# Patient Record
Sex: Male | Born: 1965 | Race: White | Hispanic: No | Marital: Married | Smoking: Current every day smoker
Health system: Southern US, Community
[De-identification: ages and names within clinical notes are randomized; demographics above are authoritative.]

## PROBLEM LIST (undated history)

## (undated) DIAGNOSIS — N200 Calculus of kidney: Secondary | ICD-10-CM

## (undated) DIAGNOSIS — M545 Low back pain, unspecified: Secondary | ICD-10-CM

## (undated) HISTORY — DX: Low back pain, unspecified: M54.50

## (undated) HISTORY — PX: LITHOTRIPSY: SUR834

## (undated) HISTORY — DX: Low back pain: M54.5

---

## 2011-10-15 ENCOUNTER — Ambulatory Visit: Payer: Self-pay

## 2014-12-01 ENCOUNTER — Encounter: Payer: Self-pay | Admitting: *Deleted

## 2014-12-01 ENCOUNTER — Emergency Department
Admission: EM | Admit: 2014-12-01 | Discharge: 2014-12-01 | Disposition: A | Discharge status: Home / Self Care | Payer: BLUE CROSS/BLUE SHIELD | Attending: Emergency Medicine | Admitting: Emergency Medicine

## 2014-12-01 ENCOUNTER — Emergency Department: Payer: BLUE CROSS/BLUE SHIELD

## 2014-12-01 DIAGNOSIS — N23 Unspecified renal colic: Secondary | ICD-10-CM | POA: Diagnosis not present

## 2014-12-01 DIAGNOSIS — Z87442 Personal history of urinary calculi: Secondary | ICD-10-CM | POA: Insufficient documentation

## 2014-12-01 DIAGNOSIS — Z720 Tobacco use: Secondary | ICD-10-CM | POA: Diagnosis not present

## 2014-12-01 DIAGNOSIS — R109 Unspecified abdominal pain: Secondary | ICD-10-CM | POA: Diagnosis present

## 2014-12-01 HISTORY — DX: Calculus of kidney: N20.0

## 2014-12-01 LAB — BASIC METABOLIC PANEL
Anion gap: 6 (ref 5–15)
BUN: 18 mg/dL (ref 6–20)
CO2: 27 mmol/L (ref 22–32)
CREATININE: 1.25 mg/dL — AB (ref 0.61–1.24)
Calcium: 9.8 mg/dL (ref 8.9–10.3)
Chloride: 103 mmol/L (ref 101–111)
GFR calc Af Amer: >60 mL/min (ref >60)
GLUCOSE: 106 mg/dL — AB (ref 65–99)
POTASSIUM: 4.1 mmol/L (ref 3.5–5.1)
Sodium: 136 mmol/L (ref 135–145)

## 2014-12-01 LAB — URINALYSIS COMPLETE WITH MICROSCOPIC (ARMC ONLY)
BACTERIA UA: NONE SEEN
Bilirubin Urine: NEGATIVE
Glucose, UA: NEGATIVE mg/dL
Ketones, ur: NEGATIVE mg/dL
LEUKOCYTES UA: NEGATIVE
NITRITE: NEGATIVE
PH: 5 (ref 5.0–8.0)
PROTEIN: NEGATIVE mg/dL
SPECIFIC GRAVITY, URINE: 1.016 (ref 1.005–1.030)
Squamous Epithelial / LPF: NONE SEEN

## 2014-12-01 LAB — CBC
HEMATOCRIT: 51.7 % (ref 40.0–52.0)
Hemoglobin: 18 g/dL (ref 13.0–18.0)
MCH: 32.3 pg (ref 26.0–34.0)
MCHC: 34.8 g/dL (ref 32.0–36.0)
MCV: 92.9 fL (ref 80.0–100.0)
Platelets: 158 10*3/uL (ref 150–440)
RBC: 5.57 MIL/uL (ref 4.40–5.90)
RDW: 13.4 % (ref 11.5–14.5)
WBC: 15.1 10*3/uL — ABNORMAL HIGH (ref 3.8–10.6)

## 2014-12-01 MED ORDER — ONDANSETRON HCL 4 MG/2ML IJ SOLN
4.0000 mg | Freq: Once | INTRAMUSCULAR | Status: AC
  Administered 2014-12-01: 4 mg via INTRAVENOUS
  Filled 2014-12-01: qty 2

## 2014-12-01 MED ORDER — HYDROMORPHONE HCL 1 MG/ML IJ SOLN
1.0000 mg | Freq: Once | INTRAMUSCULAR | Status: AC
  Administered 2014-12-01: 1 mg via INTRAVENOUS

## 2014-12-01 MED ORDER — HYDROMORPHONE HCL 1 MG/ML IJ SOLN
0.5000 mg | Freq: Once | INTRAMUSCULAR | Status: AC
  Administered 2014-12-01: 0.5 mg via INTRAVENOUS
  Filled 2014-12-01: qty 1

## 2014-12-01 MED ORDER — HYDROMORPHONE HCL 1 MG/ML IJ SOLN
INTRAMUSCULAR | Status: AC
  Administered 2014-12-01: 1 mg via INTRAVENOUS
  Filled 2014-12-01: qty 1

## 2014-12-01 NOTE — ED Provider Notes (Signed)
CSN: [id number]     Arrival date & time 12/01/14  0308 History   First MD Initiated Contact with Patient 12/01/14 [phone]     Chief Complaint  Patient presents with  . Flank Pain     (Consider location/radiation/quality/duration/timing/severity/associated sxs/prior Treatment) HPI   Patient reports right-sided flank pain starting approximately 5 hours prior to arrival. It feels just like his previous kidney stone. Most of the pain is in the right CVA area with some does radiate to radiate around 20 groin. Patient has some dysuria and urgency. He did not see any blood in the time. Patient given some pain medicine and this improved the pain to where he could stand it. Pain began to come back a second dose was given which made the pain almost resolved slightly and he could tolerate things very well.  Past Medical History  Diagnosis Date  . Kidney stone    Past Surgical History  Procedure Laterality Date  . Lithotripsy Right    History reviewed. No pertinent family history. Social History  Substance Use Topics  . Smoking status: Current Every Day Smoker    Types: Cigarettes  . Smokeless tobacco: Current User    Types: Snuff  . Alcohol Use: 1.2 oz/week    2 Cans of beer per week     Comment: daily    Review of Systems  Review of 10 systems essentially negative except for the right CVA and abdominal pain and some nausea.  Allergies  Review of patient's allergies indicates no known allergies.  Home Medications   Prior to Admission medications   Not on File   BP 138/94 mmHg  Pulse 72  Temp(Src) 97.9 F (36.6 C) (Oral)  Resp 20  Ht 6' (1.829 m)  Wt 190 lb (86.183 kg)  BMI 25.76 kg/m2  SpO2 98% Physical Exam Constitutional well-developed well-nourished male in some pain. Head normocephalic/atraumatic. Eyes pupils equal round reactive to light night extraocular movements intact. Mouth normal no erythema or exudate. Heart regular rate and rhythm no murmurs. Lungs are clear  abdomen soft bowel sounds are positive there is no tenderness to palpation percussion. There is some tenderness in the right CVA area to percussion. Extremities are supple with no tenderness and no edema.  ED Course  Procedures (including critical care time) Labs Review Labs Reviewed  BASIC METABOLIC PANEL - Abnormal; Notable for the following:    Glucose, Bld 106 (*)    Creatinine, Ser 1.25 (*)    All other components within normal limits  CBC - Abnormal; Notable for the following:    WBC 15.1 (*)    All other components within normal limits  URINALYSIS COMPLETEWITH MICROSCOPIC ([facility] ONLY) - Abnormal; Notable for the following:    Color, Urine YELLOW (*)    APPearance CLEAR (*)    Hgb urine dipstick 2+ (*)    All other components within normal limits    Imaging Review Dg Abd 1 View  12/01/2014  CLINICAL DATA:  Right flank pain starting 5 hours ago. Vomiting. History of kidney stones. EXAM: ABDOMEN - 1 VIEW COMPARISON:  None. FINDINGS: 10.7 mm calcification in the right upper quadrant likely representing a stone in the right renal pelvis. No other renal stones a demonstrated. Calcified phleboliths in the pelvis. Diffusely stool-filled colon. No small or large bowel distention. Mild degenerative changes in the spine and hips. IMPRESSION: 10.7 mm calcification projected over the right renal pelvis likely representing a renal stone. Electronically Signed   By: Burman Nieves  M.D.   On: 12/01/2014 04:58   I have personally reviewed and evaluated these images and lab results as part of my medical decision-making.   EKG Interpretation None      MDM   Final diagnoses:  Renal colic        Arnaldo NatalPaul F Gethsemane Fischler, MD 12/01/14 [id number]

## 2014-12-01 NOTE — Discharge Instructions (Signed)
Kidney Stones °Kidney stones (urolithiasis) are deposits that form inside your kidneys. The intense pain is caused by the stone moving through the urinary tract. When the stone moves, the ureter goes into spasm around the stone. The stone is usually passed in the urine.  °CAUSES  °· A disorder that makes certain neck glands produce too much parathyroid hormone (primary hyperparathyroidism). °· A buildup of uric acid crystals, similar to gout in your joints. °· Narrowing (stricture) of the ureter. °· A kidney obstruction present at birth (congenital obstruction). °· Previous surgery on the kidney or ureters. °· Numerous kidney infections. °SYMPTOMS  °· Feeling sick to your stomach (nauseous). °· Throwing up (vomiting). °· Blood in the urine (hematuria). °· Pain that usually spreads (radiates) to the groin. °· Frequency or urgency of urination. °DIAGNOSIS  °· Taking a history and physical exam. °· Blood or urine tests. °· CT scan. °· Occasionally, an examination of the inside of the urinary bladder (cystoscopy) is performed. °TREATMENT  °· Observation. °· Increasing your fluid intake. °· Extracorporeal shock wave lithotripsy--This is a noninvasive procedure that uses shock waves to break up kidney stones. °· Surgery may be needed if you have severe pain or persistent obstruction. There are various surgical procedures. Most of the procedures are performed with the use of small instruments. Only small incisions are needed to accommodate these instruments, so recovery time is minimized. °The size, location, and chemical composition are all important variables that will determine the proper choice of action for you. Talk to your health care provider to better understand your situation so that you will minimize the risk of injury to yourself and your kidney.  °HOME CARE INSTRUCTIONS  °· Drink enough water and fluids to keep your urine clear or pale yellow. This will help you to pass the stone or stone fragments. °· Strain  all urine through the provided strainer. Keep all particulate matter and stones for your health care provider to see. The stone causing the pain may be as small as a grain of salt. It is very important to use the strainer each and every time you pass your urine. The collection of your stone will allow your health care provider to analyze it and verify that a stone has actually passed. The stone analysis will often identify what you can do to reduce the incidence of recurrences. °· Only take over-the-counter or prescription medicines for pain, discomfort, or fever as directed by your health care provider. °· Keep all follow-up visits as told by your health care provider. This is important. °· Get follow-up X-rays if required. The absence of pain does not always mean that the stone has passed. It may have only stopped moving. If the urine remains completely obstructed, it can cause loss of kidney function or even complete destruction of the kidney. It is your responsibility to make sure X-rays and follow-ups are completed. Ultrasounds of the kidney can show blockages and the status of the kidney. Ultrasounds are not associated with any radiation and can be performed easily in a matter of minutes. °· Make changes to your daily diet as told by your health care provider. You may be told to: °¨ Limit the amount of salt that you eat. °¨ Eat 5 or more servings of fruits and vegetables each day. °¨ Limit the amount of meat, poultry, fish, and eggs that you eat. °· Collect a 24-hour urine sample as told by your health care provider. You may need to collect another urine sample every 6-12   months. SEEK MEDICAL CARE IF:  You experience pain that is progressive and unresponsive to any pain medicine you have been prescribed. SEEK IMMEDIATE MEDICAL CARE IF:   Pain cannot be controlled with the prescribed medicine.  You have a fever or shaking chills.  The severity or intensity of pain increases over 18 hours and is not  relieved by pain medicine.  You develop a new onset of abdominal pain.  You feel faint or pass out.  You are unable to urinate.   This information is not intended to replace advice given to you by your health care provider. Make sure you discuss any questions you have with your health care provider.   Document Released: 02/02/2005 Document Revised: 10/24/2014 Document Reviewed: 07/06/2012 Elsevier Interactive Patient Education 2016 [employer]  You have a 10.7 mm stone on the right side. The stone appears to be in the kidney. Please tell the referral urologist I did not do a CT scan now. He probably will want to do one later. Most likely he will need lithotripsy. Use the Percocet 1 or 24 times a day as needed for the pain. Please return if the pain is not being controlled with pain medicine. Return also for fever vomiting or feeling sicker.

## 2014-12-01 NOTE — ED Notes (Signed)
Pt c/o R flank pain starting 5 hrs ago. Pt c/o vomiting x 1. Pt has hx of kidney stones. Pt pale and restless in triage.

## 2014-12-04 ENCOUNTER — Ambulatory Visit (INDEPENDENT_AMBULATORY_CARE_PROVIDER_SITE_OTHER): Payer: BLUE CROSS/BLUE SHIELD | Admitting: Urology

## 2014-12-04 ENCOUNTER — Encounter: Payer: Self-pay | Admitting: Urology

## 2014-12-04 VITALS — BP 128/79 | HR 81 | Ht 72.0 in | Wt 189.4 lb

## 2014-12-04 DIAGNOSIS — N200 Calculus of kidney: Secondary | ICD-10-CM | POA: Diagnosis not present

## 2014-12-04 MED ORDER — OXYCODONE-ACETAMINOPHEN 5-325 MG PO TABS: 1.0000 | ORAL_TABLET | Freq: Four times a day (QID) | ORAL | Status: DC | PRN

## 2014-12-04 MED ORDER — CEFUROXIME AXETIL 250 MG PO TABS: 250.0000 mg | ORAL_TABLET | Freq: Two times a day (BID) | ORAL | Status: DC

## 2014-12-04 NOTE — Progress Notes (Signed)
12/04/2014 3:07 PM   Nathaniel Howard 09/23/1965 [id number]  Referring provider: Duanne Limerick, MD [address] [place], [place] [address]  Chief Complaint  Patient presents with  . Nephrolithiasis    f/u hospital, history kidney stone.Pt been trested by urologist in the past Dr. Lonna Cobb    HPI: History of nephrolithiasis. As well for years ago otherwise basically has had no other genitourinary problems has no urgency frequency dysuria gross hematuria. It pain for this right sided upper ureteral calculus 10.1 mm in side has not had any ibuprofen over 48 hours.     PMH: Past Medical History  Diagnosis Date  . Kidney stone     Surgical History: Past Surgical History  Procedure Laterality Date  . Lithotripsy Right     Home Medications:    Medication List       This list is accurate as of: 12/04/14  3:07 PM.  Always use your most recent med list.               acetaminophen 325 MG tablet  Commonly known as:  TYLENOL  Take 325 mg by mouth every 6 (six) hours as needed.     ibuprofen 200 MG tablet  Commonly known as:  ADVIL,MOTRIN  Take 200 mg by mouth every 6 (six) hours as needed.     oxyCODONE-acetaminophen 10-325 MG tablet  Commonly known as:  PERCOCET  Take 1 tablet by mouth every 4 (four) hours as needed for pain.        Allergies: No Known Allergies  Family History: Family History  Problem Relation Age of Onset  . Nephrolithiasis Father   . Prostate cancer Neg Hx   . Sickle cell trait Neg Hx   . Kidney cancer Neg Hx     Social History:  reports that he has been smoking Cigarettes.  He has been smoking about 0.50 packs per day. His smokeless tobacco use includes Snuff. He reports that he drinks about 1.2 oz of alcohol per week. He reports that he does not use illicit drugs.  ROS: UROLOGY Frequent Urination?: Yes Hard to postpone urination?: No Burning/pain with urination?: No Get up at night to urinate?: Yes Leakage of urine?:  No Urine stream starts and stops?: Yes Trouble starting stream?: No Do you have to strain to urinate?: No Blood in urine?: No Urinary tract infection?: No Sexually transmitted disease?: No Injury to kidneys or bladder?: No Painful intercourse?: No Weak stream?: No Erection problems?: Yes Penile pain?: No  Gastrointestinal Nausea?: No Vomiting?: No Indigestion/heartburn?: No Diarrhea?: No Constipation?: No  Constitutional Fever: No Weight loss?: No Fatigue?: No  Skin Skin rash/lesions?: No Itching?: No  Eyes Blurred vision?: No Double vision?: No  Ears/Nose/Throat Sore throat?: No Sinus problems?: No  Hematologic/Lymphatic Swollen glands?: No Easy bruising?: No  Cardiovascular Leg swelling?: No Chest pain?: No  Respiratory Cough?: No Shortness of breath?: No  Endocrine Excessive thirst?: No  Musculoskeletal Back pain?: Yes Joint pain?: No  Neurological Headaches?: No Dizziness?: No  Psychologic Depression?: No Anxiety?: No  Physical Exam: BP 128/79 mmHg  Pulse 81  Ht 6' (1.829 m)  Wt 189 lb 6.4 oz (85.911 kg)  BMI 25.68 kg/m2  Constitutional:  Alert and oriented, No acute distress. HEENT: [place] AT, moist mucus membranes.  Trachea midline, no masses. Cardiovascular: No clubbing, cyanosis, or edema. Respiratory: Normal respiratory effort, no increased work of breathing. GI: Abdomen is soft, nontender, nondistended, no abdominal masses GU: No CVA tenderness.  Skin: No rashes, bruises or suspicious lesions. Lymph: No cervical or inguinal adenopathy. Neurologic: Grossly intact, no focal deficits, moving all 4 extremities. Psychiatric: Normal mood and affect.  Laboratory Data: Lab Results  Component Value Date   WBC 15.1* 12/01/2014   HGB 18.0 12/01/2014   HCT 51.7 12/01/2014   MCV 92.9 12/01/2014   PLT 158 12/01/2014    Lab Results  Component Value Date   CREATININE 1.25* 12/01/2014    No results found for: PSA  No results found  for: TESTOSTERONE  No results found for: HGBA1C  Urinalysis    Component Value Date/Time   COLORURINE YELLOW* 12/01/2014 0337   APPEARANCEUR CLEAR* 12/01/2014 0337   LABSPEC 1.016 12/01/2014 0337   PHURINE 5.0 12/01/2014 0337   GLUCOSEU NEGATIVE 12/01/2014 0337   HGBUR 2+* 12/01/2014 0337   BILIRUBINUR NEGATIVE 12/01/2014 0337   KETONESUR NEGATIVE 12/01/2014 0337   PROTEINUR NEGATIVE 12/01/2014 0337   NITRITE NEGATIVE 12/01/2014 0337   LEUKOCYTESUR NEGATIVE 12/01/2014 [id number]    Pertinent Imaging: KUB  Assessment & Plan:  Right upper ureteral calculus 10.1 mm plan right ESWL  1. Right nephrolithiasis second stone 4 years plan ESWL    No Follow-up on file.  Lorraine Laxichard D Teodoro Jeffreys, MD  [facility] [address], [address] [place], [place] [address] [phone]

## 2014-12-06 ENCOUNTER — Encounter
Admission: RE | Disposition: A | Discharge status: Home / Self Care | Payer: Self-pay | Source: Ambulatory Visit | Attending: Urology

## 2014-12-06 ENCOUNTER — Encounter: Payer: Self-pay | Admitting: *Deleted

## 2014-12-06 ENCOUNTER — Ambulatory Visit
Admission: RE | Admit: 2014-12-06 | Discharge: 2014-12-06 | Disposition: A | Discharge status: Home / Self Care | Payer: BLUE CROSS/BLUE SHIELD | Source: Ambulatory Visit | Attending: Urology | Admitting: Urology

## 2014-12-06 DIAGNOSIS — N201 Calculus of ureter: Secondary | ICD-10-CM | POA: Insufficient documentation

## 2014-12-06 HISTORY — PX: EXTRACORPOREAL SHOCK WAVE LITHOTRIPSY: SHX1557

## 2014-12-06 SURGERY — LITHOTRIPSY, ESWL
Anesthesia: Moderate Sedation | Laterality: Right

## 2014-12-06 MED ORDER — DIPHENHYDRAMINE HCL 25 MG PO CAPS
ORAL_CAPSULE | ORAL | Status: AC
  Filled 2014-12-06: qty 1

## 2014-12-06 MED ORDER — DIPHENHYDRAMINE HCL 25 MG PO CAPS
25.0000 mg | ORAL_CAPSULE | ORAL | Status: AC
Start: 2014-12-06 — End: 2014-12-06
  Administered 2014-12-06: 25 mg via ORAL

## 2014-12-06 MED ORDER — DIAZEPAM 5 MG PO TABS
10.0000 mg | ORAL_TABLET | ORAL | Status: AC
  Administered 2014-12-06: 10 mg via ORAL

## 2014-12-06 MED ORDER — DEXTROSE 5 % IV SOLN
1.0000 g | Freq: Once | INTRAVENOUS | Status: AC
  Administered 2014-12-06: 1 g via INTRAVENOUS
  Filled 2014-12-06: qty 10

## 2014-12-06 MED ORDER — SODIUM CHLORIDE 0.9 % IV SOLN
INTRAVENOUS | Status: DC
  Administered 2014-12-06: 18:00:00 via INTRAVENOUS

## 2014-12-06 MED ORDER — DIAZEPAM 5 MG PO TABS
ORAL_TABLET | ORAL | Status: AC
  Filled 2014-12-06: qty 2

## 2014-12-06 MED ORDER — DEXTROSE-NACL 5-0.45 % IV SOLN: INTRAVENOUS | Status: DC

## 2014-12-06 MED ORDER — ONDANSETRON HCL 4 MG/2ML IJ SOLN
4.0000 mg | Freq: Once | INTRAMUSCULAR | Status: AC
  Administered 2014-12-06: 4 mg via INTRAVENOUS

## 2014-12-06 MED ORDER — ONDANSETRON HCL 4 MG/2ML IJ SOLN
INTRAMUSCULAR | Status: AC
  Administered 2014-12-06: 4 mg via INTRAVENOUS
  Filled 2014-12-06: qty 2

## 2014-12-06 NOTE — Discharge Instructions (Signed)

## 2014-12-06 NOTE — OR Nursing (Signed)
Pt to PACU via bed awaiting procedure

## 2014-12-07 ENCOUNTER — Encounter: Payer: Self-pay | Admitting: Urology

## 2014-12-18 ENCOUNTER — Ambulatory Visit: Payer: BLUE CROSS/BLUE SHIELD | Admitting: Urology

## 2015-01-04 ENCOUNTER — Ambulatory Visit
Admission: RE | Admit: 2015-01-04 | Discharge: 2015-01-04 | Disposition: A | Discharge status: Home / Self Care | Payer: BLUE CROSS/BLUE SHIELD | Source: Ambulatory Visit | Attending: Urology | Admitting: Urology

## 2015-01-04 ENCOUNTER — Encounter: Payer: Self-pay | Admitting: Urology

## 2015-01-04 ENCOUNTER — Ambulatory Visit (INDEPENDENT_AMBULATORY_CARE_PROVIDER_SITE_OTHER): Payer: BLUE CROSS/BLUE SHIELD | Admitting: Urology

## 2015-01-04 VITALS — BP 120/84 | HR 86 | Ht 72.0 in | Wt 183.8 lb

## 2015-01-04 DIAGNOSIS — N200 Calculus of kidney: Secondary | ICD-10-CM | POA: Diagnosis present

## 2015-01-04 LAB — URINALYSIS, COMPLETE
BILIRUBIN UA: NEGATIVE
GLUCOSE, UA: NEGATIVE
Ketones, UA: NEGATIVE
Nitrite, UA: NEGATIVE
PH UA: 5.5 (ref 5.0–7.5)
PROTEIN UA: NEGATIVE
Specific Gravity, UA: 1.015 (ref 1.005–1.030)
Urobilinogen, Ur: 0.2 mg/dL (ref 0.2–1.0)

## 2015-01-04 LAB — MICROSCOPIC EXAMINATION: RBC, UA: >30 /hpf — ABNORMAL HIGH (ref 0–?)

## 2015-01-04 MED ORDER — TAMSULOSIN HCL 0.4 MG PO CAPS: 0.4000 mg | ORAL_CAPSULE | Freq: Every day | ORAL | Status: DC

## 2015-01-04 NOTE — Progress Notes (Signed)
01/04/2015 4:09 PM   Nathaniel Howard E Caster 08/04/1965 [id number]  Referring provider: Duanne Limerickeanna C Jones, MD [address] [place], [place] [address]  Chief Complaint  Patient presents with  . Follow-up    lithotripsy, KUB    HPI: The patient is a 49 year old male who is status post right ESWL for a right upper 1 cm ureteral calculus.  KUB today shows fragments in the right mid pole of the kidney as well as Steinstrasse in the right ureter   the patient is doing well since surgery. His pain is well-controlled. He is passing fragments of stone without difficulty.  PMH: Past Medical History  Diagnosis Date  . Kidney stone     Surgical History: Past Surgical History  Procedure Laterality Date  . Lithotripsy Right   . Extracorporeal shock wave lithotripsy Right 12/06/2014    Procedure: EXTRACORPOREAL SHOCK WAVE LITHOTRIPSY (ESWL);  Surgeon: Lorraine Laxichard D Hart, MD;  Location: [facility] ORS;  Service: Urology;  Laterality: Right;    Home Medications:    Medication List       This list is accurate as of: 01/04/15  4:09 PM.  Always use your most recent med list.               acetaminophen 325 MG tablet  Commonly known as:  TYLENOL  Take 325 mg by mouth every 6 (six) hours as needed.     cefUROXime 250 MG tablet  Commonly known as:  CEFTIN  Take 1 tablet (250 mg total) by mouth 2 (two) times daily with a meal.     ibuprofen 200 MG tablet  Commonly known as:  ADVIL,MOTRIN  Take 200 mg by mouth every 6 (six) hours as needed.     oxyCODONE-acetaminophen 10-325 MG tablet  Commonly known as:  PERCOCET  Take 1 tablet by mouth every 4 (four) hours as needed for pain.     oxyCODONE-acetaminophen 5-325 MG tablet  Commonly known as:  ROXICET  Take 1 tablet by mouth every 6 (six) hours as needed for severe pain.        Allergies: No Known Allergies  Family History: Family History  Problem Relation Age of Onset  . Nephrolithiasis Father   . Prostate cancer Neg Hx   .  Sickle cell trait Neg Hx   . Kidney cancer Neg Hx     Social History:  reports that he has been smoking Cigarettes.  He has been smoking about 0.50 packs per day. His smokeless tobacco use includes Snuff. He reports that he drinks about 1.2 oz of alcohol per week. He reports that he does not use illicit drugs.  ROS: UROLOGY Frequent Urination?: No Hard to postpone urination?: No Burning/pain with urination?: No Get up at night to urinate?: Yes Leakage of urine?: No Urine stream starts and stops?: No Trouble starting stream?: No Do you have to strain to urinate?: No Blood in urine?: Yes Urinary tract infection?: No Sexually transmitted disease?: No Injury to kidneys or bladder?: No Painful intercourse?: No Weak stream?: No Erection problems?: No Penile pain?: No  Gastrointestinal Nausea?: No Vomiting?: No Indigestion/heartburn?: No Diarrhea?: No Constipation?: No  Constitutional Fever: No Night sweats?: No Weight loss?: No  Skin Skin rash/lesions?: No Itching?: No  Eyes Blurred vision?: No Double vision?: No  Ears/Nose/Throat Sore throat?: No Sinus problems?: No  Hematologic/Lymphatic Swollen glands?: No Easy bruising?: No  Cardiovascular Leg swelling?: No Chest pain?: No  Respiratory Cough?: No Shortness of breath?: No  Endocrine Excessive thirst?:  No  Musculoskeletal Back pain?: Yes Joint pain?: No  Neurological Headaches?: No Dizziness?: No  Psychologic Depression?: No Anxiety?: No  Physical Exam: BP 120/84 mmHg  Pulse 86  Ht 6' (1.829 m)  Wt 183 lb 12.8 oz (83.371 kg)  BMI 24.92 kg/m2  Constitutional:  Alert and oriented, No acute distress. HEENT: [place] AT, moist mucus membranes.  Trachea midline, no masses. Cardiovascular: No clubbing, cyanosis, or edema. Respiratory: Normal respiratory effort, no increased work of breathing. GI: Abdomen is soft, nontender, nondistended, no abdominal masses GU: No CVA tenderness.  Skin: No  rashes, bruises or suspicious lesions. Lymph: No cervical or inguinal adenopathy. Neurologic: Grossly intact, no focal deficits, moving all 4 extremities. Psychiatric: Normal mood and affect.  Laboratory Data: Lab Results  Component Value Date   WBC 15.1* 12/01/2014   HGB 18.0 12/01/2014   HCT 51.7 12/01/2014   MCV 92.9 12/01/2014   PLT 158 12/01/2014    Lab Results  Component Value Date   CREATININE 1.25* 12/01/2014    No results found for: PSA  No results found for: TESTOSTERONE  No results found for: HGBA1C  Urinalysis    Component Value Date/Time   COLORURINE YELLOW* 12/01/2014 0337   APPEARANCEUR CLEAR* 12/01/2014 0337   LABSPEC 1.016 12/01/2014 0337   PHURINE 5.0 12/01/2014 0337   GLUCOSEU NEGATIVE 12/01/2014 0337   HGBUR 2+* 12/01/2014 0337   BILIRUBINUR NEGATIVE 12/01/2014 0337   KETONESUR NEGATIVE 12/01/2014 0337   PROTEINUR NEGATIVE 12/01/2014 0337   NITRITE NEGATIVE 12/01/2014 0337   LEUKOCYTESUR NEGATIVE 12/01/2014 0337    Pertinent Imaging: KUB today shows fragments in the right mid pole of the kidney as well as Steinstrasse in the right ureter. His pre-litho-KUB showed a 1 cm right UPJ stone.  Assessment & Plan:    Patient has residual stone fragments in the right mid pole of the kidney as well as Steinstrasse of the right ureter after undergoing a lithotripsy for a 1 cm UPJ stone. He is currently asymptomatic this time.  1. Nephrolithiasis -repeat KUB in one month to monitor his progress on passing the residual stones in his ureter and renal pelvis.  We'll start him on Flomax 0.4 mg at night. He was warned of the risk of orthostatic hypotension. We also alerted to alert the office or the emergency room if he has uncontrolled pain or develops a fever.   Return in about 4 weeks (around 02/01/2015) for KUB prior.  Hildred Laser, MD  [facility] [address] [place], [place] [address] [phone]

## 2015-01-14 ENCOUNTER — Encounter: Payer: Self-pay | Admitting: Urology

## 2015-01-17 ENCOUNTER — Encounter: Payer: Self-pay | Admitting: Urology

## 2015-02-15 ENCOUNTER — Ambulatory Visit (INDEPENDENT_AMBULATORY_CARE_PROVIDER_SITE_OTHER): Payer: BLUE CROSS/BLUE SHIELD | Admitting: Urology

## 2015-02-15 ENCOUNTER — Ambulatory Visit
Admission: RE | Admit: 2015-02-15 | Discharge: 2015-02-15 | Disposition: A | Discharge status: Home / Self Care | Payer: BLUE CROSS/BLUE SHIELD | Source: Ambulatory Visit | Attending: Urology | Admitting: Urology

## 2015-02-15 ENCOUNTER — Encounter: Payer: Self-pay | Admitting: Urology

## 2015-02-15 VITALS — BP 121/79 | HR 90 | Ht 72.0 in | Wt 183.4 lb

## 2015-02-15 DIAGNOSIS — N200 Calculus of kidney: Secondary | ICD-10-CM

## 2015-02-15 DIAGNOSIS — Z87442 Personal history of urinary calculi: Secondary | ICD-10-CM | POA: Insufficient documentation

## 2015-02-15 NOTE — Progress Notes (Signed)
02/15/2015 2:23 PM   Nathaniel Howard 02/07/1966 [id number]  Referring provider: Duanne Limerick, MD [address] [place], [place] [address]  Chief Complaint  Patient presents with  . Follow-up    Nephrolithiasis    HPI: The patient is a 49 year old male who is status post right ESWL for a right upper 1 cm ureteral calculus. KUB today shows fragments in the right mid pole of the kidney as well as Steinstrasse in the right ureter  The patient is doing well since surgery. His pain is well-controlled. He is passing fragments of stone without difficulty.    December 2016 Interval History: The patient returns today for repeat KUB. His KUB shows no residual stone fragments. He brought a large volume of stones in a plastic bag. His stone report from his last visit showed calcium oxalate monohydrate and dihydrate. This is only a second stone. His last one was 12 years ago. He is having no symptoms at this time.   PMH: Past Medical History  Diagnosis Date  . Kidney stone     Surgical History: Past Surgical History  Procedure Laterality Date  . Lithotripsy Right   . Extracorporeal shock wave lithotripsy Right 12/06/2014    Procedure: EXTRACORPOREAL SHOCK WAVE LITHOTRIPSY (ESWL);  Surgeon: Lorraine Lax, MD;  Location: [facility] ORS;  Service: Urology;  Laterality: Right;    Home Medications:    Medication List       This list is accurate as of: 02/15/15  2:23 PM.  Always use your most recent med list.               acetaminophen 325 MG tablet  Commonly known as:  TYLENOL  Take 325 mg by mouth every 6 (six) hours as needed. Reported on 02/15/2015     cefUROXime 250 MG tablet  Commonly known as:  CEFTIN  Take 1 tablet (250 mg total) by mouth 2 (two) times daily with a meal.     ibuprofen 200 MG tablet  Commonly known as:  ADVIL,MOTRIN  Take 200 mg by mouth every 6 (six) hours as needed. Reported on 02/15/2015     oxyCODONE-acetaminophen 10-325 MG tablet    Commonly known as:  PERCOCET  Take 1 tablet by mouth every 4 (four) hours as needed for pain. Reported on 02/15/2015     oxyCODONE-acetaminophen 5-325 MG tablet  Commonly known as:  ROXICET  Take 1 tablet by mouth every 6 (six) hours as needed for severe pain.     tamsulosin 0.4 MG Caps capsule  Commonly known as:  FLOMAX  Take 1 capsule (0.4 mg total) by mouth daily.        Allergies: No Known Allergies  Family History: Family History  Problem Relation Age of Onset  . Nephrolithiasis Father   . Prostate cancer Neg Hx   . Sickle cell trait Neg Hx   . Kidney cancer Neg Hx     Social History:  reports that he has been smoking Cigarettes.  He has been smoking about 0.50 packs per day. His smokeless tobacco use includes Snuff. He reports that he drinks about 1.2 oz of alcohol per week. He reports that he does not use illicit drugs.  ROS:                                        Physical Exam: BP 121/79 mmHg  Pulse  90  Ht 6' (1.829 m)  Wt 183 lb 6.4 oz (83.19 kg)  BMI 24.87 kg/m2  Constitutional:  Alert and oriented, No acute distress. HEENT: [place] AT, moist mucus membranes.  Trachea midline, no masses. Cardiovascular: No clubbing, cyanosis, or edema. Respiratory: Normal respiratory effort, no increased work of breathing. GI: Abdomen is soft, nontender, nondistended, no abdominal masses GU: No CVA tenderness.  Skin: No rashes, bruises or suspicious lesions. Lymph: No cervical or inguinal adenopathy. Neurologic: Grossly intact, no focal deficits, moving all 4 extremities. Psychiatric: Normal mood and affect.  Laboratory Data: Lab Results  Component Value Date   WBC 15.1* 12/01/2014   HGB 18.0 12/01/2014   HCT 51.7 12/01/2014   MCV 92.9 12/01/2014   PLT 158 12/01/2014    Lab Results  Component Value Date   CREATININE 1.25* 12/01/2014    No results found for: PSA  No results found for: TESTOSTERONE  No results found for:  HGBA1C  Urinalysis    Component Value Date/Time   COLORURINE YELLOW* 12/01/2014 0337   APPEARANCEUR CLEAR* 12/01/2014 0337   LABSPEC 1.016 12/01/2014 0337   PHURINE 5.0 12/01/2014 0337   GLUCOSEU Negative 01/04/2015 1535   HGBUR 2+* 12/01/2014 0337   BILIRUBINUR Negative 01/04/2015 1535   BILIRUBINUR NEGATIVE 12/01/2014 0337   KETONESUR NEGATIVE 12/01/2014 0337   PROTEINUR NEGATIVE 12/01/2014 0337   NITRITE Negative 01/04/2015 1535   NITRITE NEGATIVE 12/01/2014 0337   LEUKOCYTESUR Trace* 01/04/2015 1535   LEUKOCYTESUR NEGATIVE 12/01/2014 0337    Assessment & Plan:    The patient is cleared his stones on KUB today.  1. Nephrolithiasis -No evidence of residual stone burden. The patient will follow up on an as-needed basis.   Hildred LaserBrian James Luvina Poirier, MD  [facility] [address], [address] [place], [place] [address] [phone]

## 2015-04-29 ENCOUNTER — Ambulatory Visit
Admission: EM | Admit: 2015-04-29 | Discharge: 2015-04-29 | Disposition: A | Discharge status: Home / Self Care | Payer: BLUE CROSS/BLUE SHIELD | Attending: Family Medicine | Admitting: Family Medicine

## 2015-04-29 DIAGNOSIS — B349 Viral infection, unspecified: Secondary | ICD-10-CM

## 2015-04-29 MED ORDER — BENZONATATE 100 MG PO CAPS: 100.0000 mg | ORAL_CAPSULE | Freq: Three times a day (TID) | ORAL | Status: DC | PRN

## 2015-04-29 MED ORDER — GUAIFENESIN-CODEINE 100-10 MG/5ML PO SOLN: 10.0000 mL | Freq: Every evening | ORAL | Status: DC | PRN

## 2015-04-29 MED ORDER — OSELTAMIVIR PHOSPHATE 75 MG PO CAPS: 75.0000 mg | ORAL_CAPSULE | Freq: Two times a day (BID) | ORAL | Status: DC

## 2015-04-29 NOTE — ED Notes (Signed)
States started Saturday with cough, runny nose and body aches. Been feeling 'hot and cold'

## 2015-04-29 NOTE — ED Provider Notes (Signed)
[facility]  ____________________________________________  Time seen: Approximately 6:56 PM  I have reviewed the triage vital signs and the nursing notes.   HISTORY  Chief Complaint Influenza   HPI Nathaniel Howard is a 50 y.o. male presents for complaints of 2 days of runny nose, nasal congestion, cough, body aches and fever. States fever at home last night 101 degrees orally. Patient reports wife with similar. Reports that just prior to symptom onset he and his wife are exposed to several family members that were recently diagnosed with influenza.Reports continues to eat and drink well. States has been taking over the counter tylenol and ibuprofen as needed. Denies sore throat.  Denies chest pain, shortness of breath, dizziness, weakness, hearing changes, abdominal pain, dysuria, neck pain, back pain, extremity pain or extremity swelling. Denies recent sickness.  PCP: Yetta Barre   Past Medical History  Diagnosis Date  . Kidney stone     There are no active problems to display for this patient.   Past Surgical History  Procedure Laterality Date  . Lithotripsy Right   . Extracorporeal shock wave lithotripsy Right 12/06/2014    Procedure: EXTRACORPOREAL SHOCK WAVE LITHOTRIPSY (ESWL);  Surgeon: Lorraine Lax, MD;  Location: [facility] ORS;  Service: Urology;  Laterality: Right;    Current Outpatient Rx  Name  Route  Sig  Dispense  Refill  . acetaminophen (TYLENOL) 325 MG tablet   Oral   Take 325 mg by mouth every 6 (six) hours as needed. Reported on 02/15/2015         . ibuprofen (ADVIL,MOTRIN) 200 MG tablet   Oral   Take 200 mg by mouth every 6 (six) hours as needed. Reported on 02/15/2015         .           .           .           .             Allergies Review of patient's allergies indicates no known allergies.  Family History  Problem Relation Age of Onset  . Nephrolithiasis Father   . Prostate cancer Neg Hx   . Sickle cell trait Neg Hx   . Kidney  cancer Neg Hx     Social History Social History  Substance Use Topics  . Smoking status: Current Every Day Smoker -- 0.50 packs/day    Types: Cigarettes  . Smokeless tobacco: Current User    Types: Snuff  . Alcohol Use: 1.2 oz/week    2 Cans of beer per week     Comment: daily    Review of Systems Constitutional: Subjective fevers.  Eyes: No visual changes. ENT: No sore throat. Cardiovascular: Denies chest pain. Respiratory: Denies shortness of breath. Gastrointestinal: No abdominal pain.  No nausea, no vomiting.  No diarrhea.  No constipation. Genitourinary: Negative for dysuria. Musculoskeletal: Negative for back pain. Skin: Negative for rash. Neurological: Negative for headaches, focal weakness or numbness.  10-point ROS otherwise negative.  ____________________________________________   PHYSICAL EXAM:  VITAL SIGNS: ED Triage Vitals  Enc Vitals Group     BP 04/29/15 1813 119/75 mmHg     Pulse Rate 04/29/15 1813 110 Recheck 92     Resp 04/29/15 1813 18     Temp 04/29/15 1813 97.6 F (36.4 C)     Temp Source 04/29/15 1813 Oral     SpO2 04/29/15 1813 98 %     Weight 04/29/15 1813 185  lb (83.915 kg)     Height 04/29/15 1813 6' (1.829 m)     Head Cir --      Peak Flow --      Pain Score 04/29/15 1815 2     Pain Loc --      Pain Edu? --      Excl. in GC? --    Constitutional: Alert and oriented. Well appearing and in no acute distress. Eyes: Conjunctivae are normal. PERRL. EOMI. Head: Atraumatic. No sinus tenderness to palpation. No swelling. No erythema.  Ears: no erythema, normal TMs bilaterally.   Nose:Nasal congestion with clear rhinorrhea  Mouth/Throat: Mucous membranes are moist. No pharyngeal erythema. No tonsillar swelling or exudate.  Neck: No stridor.  No cervical spine tenderness to palpation. Hematological/Lymphatic/Immunilogical: No cervical lymphadenopathy. Cardiovascular: Normal rate, regular rhythm. Grossly normal heart sounds.  Good  peripheral circulation. Respiratory: Normal respiratory effort.  No retractions. Lungs CTAB.No wheezes, rales or rhonchi. Good air movement.  Gastrointestinal: Soft and nontender. Normal Bowel sounds. No CVA tenderness. Musculoskeletal: No lower or upper extremity tenderness nor edema. No cervical, thoracic or lumbar tenderness to palpation. Neurologic:  Normal speech and language. No gross focal neurologic deficits are appreciated. No gait instability. Skin:  Skin is warm, dry and intact. No rash noted. Psychiatric: Mood and affect are normal. Speech and behavior are normal.  ____________________________________________   LABS (all labs ordered are listed, but only abnormal results are displayed)  Labs Reviewed - No data to display ____________________________________________   INITIAL IMPRESSION / ASSESSMENT AND PLAN / ED COURSE  Pertinent labs & imaging results that were available during my care of the patient were reviewed by me and considered in my medical decision making (see chart for details).  Very well appearing. No acute distress. Presents for complaints of 2 days of runny nose, nasal congestion, cough, body aches and fever. Wife with similar. States just prior to symptom onset around several family members who had the flu. Lungs clear throughout, abdomen soft and nontender. Well appearing. Suspect viral infection such as influenza. As with positive exposures, and clinical appearance, suspect influenza. Discussed treatment and evaluation with patient and spouse and discussed treatment with oral tamiflu. Patient states he would prefer to go ahead with treatment instead of swabbing for influenza. Request rx for tamiflu. Discussed indication, risks and benefits of medications with patient.Will treat supportively with tamiflu, prn guaifenesin and codeine at night. Encourage rest, fluids, otc ibuprofen or tylenol as needed.   Discussed follow up with Primary care physician this week.  Discussed follow up and return parameters including no resolution or any worsening concerns. Patient verbalized understanding and agreed to plan.   ____________________________________________   FINAL CLINICAL IMPRESSION(S) / ED DIAGNOSES  Final diagnoses:  Viral illness      Note: This dictation was prepared with Dragon dictation along with smaller phrase technology. Any transcriptional errors that result from this process are unintentional.    Renford DillsLindsey Bandon Sherwin, NP 05/03/15 1141

## 2015-04-29 NOTE — Discharge Instructions (Signed)
Take medication as prescribed. Rest. Drink plenty of fluids.  ° °Follow up with your primary care physician this week as needed. Return to Urgent care for new or worsening concerns.  ° ° °Viral Infections °A viral infection can be caused by different types of viruses. Most viral infections are not serious and resolve on their own. However, some infections may cause severe symptoms and may lead to further complications. °SYMPTOMS °Viruses can frequently cause: °· Minor sore throat. °· Aches and pains. °· Headaches. °· Runny nose. °· Different types of rashes. °· Watery eyes. °· Tiredness. °· Cough. °· Loss of appetite. °· Gastrointestinal infections, resulting in nausea, vomiting, and diarrhea. °These symptoms do not respond to antibiotics because the infection is not caused by bacteria. However, you might catch a bacterial infection following the viral infection. This is sometimes called a "superinfection." Symptoms of such a bacterial infection may include: °· Worsening sore throat with pus and difficulty swallowing. °· Swollen neck glands. °· Chills and a high or persistent fever. °· Severe headache. °· Tenderness over the sinuses. °· Persistent overall ill feeling (malaise), muscle aches, and tiredness (fatigue). °· Persistent cough. °· Yellow, green, or brown mucus production with coughing. °HOME CARE INSTRUCTIONS  °· Only take over-the-counter or prescription medicines for pain, discomfort, diarrhea, or fever as directed by your caregiver. °· Drink enough water and fluids to keep your urine clear or pale yellow. Sports drinks can provide valuable electrolytes, sugars, and hydration. °· Get plenty of rest and maintain proper nutrition. Soups and broths with crackers or rice are fine. °SEEK IMMEDIATE MEDICAL CARE IF:  °· You have severe headaches, shortness of breath, chest pain, neck pain, or an unusual rash. °· You have uncontrolled vomiting, diarrhea, or you are unable to keep down fluids. °· You or your child  has an oral temperature above 102° F (38.9° C), not controlled by medicine. °· Your baby is older than 3 months with a rectal temperature of 102° F (38.9° C) or higher. °· Your baby is 3 months old or younger with a rectal temperature of 100.4° F (38° C) or higher. °MAKE SURE YOU:  °· Understand these instructions. °· Will watch your condition. °· Will get help right away if you are not doing well or get worse. °  °This information is not intended to replace advice given to you by your health care provider. Make sure you discuss any questions you have with your health care provider. °  °Document Released: 11/12/2004 Document Revised: 04/27/2011 Document Reviewed: 07/11/2014 °Elsevier Interactive Patient Education ©2016 [employer] ° °

## 2016-11-20 ENCOUNTER — Ambulatory Visit (INDEPENDENT_AMBULATORY_CARE_PROVIDER_SITE_OTHER): Payer: BLUE CROSS/BLUE SHIELD | Admitting: Family Medicine

## 2016-11-20 ENCOUNTER — Encounter: Payer: Self-pay | Admitting: Family Medicine

## 2016-11-20 VITALS — BP 139/90 | HR 93 | Temp 97.6°F | Ht 71.3 in | Wt 182.2 lb

## 2016-11-20 DIAGNOSIS — N200 Calculus of kidney: Secondary | ICD-10-CM

## 2016-11-20 DIAGNOSIS — Z23 Encounter for immunization: Secondary | ICD-10-CM | POA: Diagnosis not present

## 2016-11-20 DIAGNOSIS — Z114 Encounter for screening for human immunodeficiency virus [HIV]: Secondary | ICD-10-CM | POA: Diagnosis not present

## 2016-11-20 DIAGNOSIS — E663 Overweight: Secondary | ICD-10-CM

## 2016-11-20 DIAGNOSIS — N401 Enlarged prostate with lower urinary tract symptoms: Secondary | ICD-10-CM

## 2016-11-20 DIAGNOSIS — R350 Frequency of micturition: Secondary | ICD-10-CM

## 2016-11-20 DIAGNOSIS — N400 Enlarged prostate without lower urinary tract symptoms: Secondary | ICD-10-CM | POA: Insufficient documentation

## 2016-11-20 DIAGNOSIS — Z13220 Encounter for screening for lipoid disorders: Secondary | ICD-10-CM

## 2016-11-20 LAB — UA/M W/RFLX CULTURE, ROUTINE
BILIRUBIN UA: NEGATIVE
GLUCOSE, UA: NEGATIVE
Ketones, UA: NEGATIVE
LEUKOCYTES UA: NEGATIVE
Nitrite, UA: NEGATIVE
PH UA: 6.5 (ref 5.0–7.5)
PROTEIN UA: NEGATIVE
RBC UA: NEGATIVE
SPEC GRAV UA: 1.02 (ref 1.005–1.030)
UUROB: 1 mg/dL (ref 0.2–1.0)

## 2016-11-20 LAB — MICROSCOPIC EXAMINATION: BACTERIA UA: NONE SEEN

## 2016-11-20 MED ORDER — TAMSULOSIN HCL 0.4 MG PO CAPS: 0.4000 mg | ORAL_CAPSULE | Freq: Every day | ORAL | 3 refills | Status: DC

## 2016-11-20 NOTE — Assessment & Plan Note (Signed)
No issues currently. Continue to monitor. Call with any concerns.

## 2016-11-20 NOTE — Assessment & Plan Note (Signed)
Will start flomax. Call with any concerns. Recheck in about 4-6 weeks.

## 2016-11-20 NOTE — Progress Notes (Signed)
BP 139/90 (BP Location: Left Arm, Patient Position: Sitting, Cuff Size: Normal)   Pulse 93   Temp 97.6 F (36.4 C)   Ht 5' 11.3" (1.811 m)   Wt 182 lb 3 oz (82.6 kg)   SpO2 98%   BMI 25.20 kg/m    Subjective:    Patient ID: Nathaniel Howard, male    DOB: 05/13/1965, 51 y.o.   MRN: [id number]  HPI: Nathaniel Howard is a 51 y.o. male who presents today to establish care.   Chief Complaint  Patient presents with  . Establish Care   Has not had a kidney stone in about 2 years. Hasn't seen a doctor in many years. Unsure the last time he had blood work  BPH BPH status: uncontrolled Satisfied with current treatment?: no Medication side effects: not on anything Duration: months Nocturia: 2-3x per night Urinary frequency:yes Incomplete voiding: yes Urgency: yes Weak urinary stream: yes Straining to start stream: yes Dysuria: no Onset: gradual Severity: moderate  Active Ambulatory Problems    Diagnosis Date Noted  . Kidney stone   . BPH (benign prostatic hyperplasia) 11/20/2016   Resolved Ambulatory Problems    Diagnosis Date Noted  . No Resolved Ambulatory Problems   Past Medical History:  Diagnosis Date  . Kidney stone   . Low back pain    Past Surgical History:  Procedure Laterality Date  . EXTRACORPOREAL SHOCK WAVE LITHOTRIPSY Right 12/06/2014   Procedure: EXTRACORPOREAL SHOCK WAVE LITHOTRIPSY (ESWL);  Surgeon: Lorraine Lax, MD;  Location: [facility] ORS;  Service: Urology;  Laterality: Right;  . LITHOTRIPSY Right    Outpatient Encounter Prescriptions as of 11/20/2016  Medication Sig  . fluticasone (FLONASE) 50 MCG/ACT nasal spray Place 2 sprays into both nostrils daily.  Marland Kitchen acetaminophen (TYLENOL) 325 MG tablet Take 325 mg by mouth every 6 (six) hours as needed. Reported on 02/15/2015  . ibuprofen (ADVIL,MOTRIN) 200 MG tablet Take 200 mg by mouth every 6 (six) hours as needed. Reported on 02/15/2015  . oxyCODONE-acetaminophen (PERCOCET) 10-325 MG tablet Take 1  tablet by mouth every 4 (four) hours as needed for pain. Reported on 02/15/2015  . tamsulosin (FLOMAX) 0.4 MG CAPS capsule Take 1 capsule (0.4 mg total) by mouth daily.  . [DISCONTINUED] benzonatate (TESSALON PERLES) 100 MG capsule Take 1 capsule (100 mg total) by mouth 3 (three) times daily as needed for cough.  . [DISCONTINUED] cefUROXime (CEFTIN) 250 MG tablet Take 1 tablet (250 mg total) by mouth 2 (two) times daily with a meal. (Patient not taking: Reported on 01/04/2015)  . [DISCONTINUED] guaiFENesin-codeine 100-10 MG/5ML syrup Take 10 mLs by mouth at bedtime as needed for cough (do not drive or operate heavy machinery while taking as can cause drowsiness.).  . [DISCONTINUED] oseltamivir (TAMIFLU) 75 MG capsule Take 1 capsule (75 mg total) by mouth every 12 (twelve) hours.  . [DISCONTINUED] oxyCODONE-acetaminophen (ROXICET) 5-325 MG tablet Take 1 tablet by mouth every 6 (six) hours as needed for severe pain. (Patient not taking: Reported on 01/04/2015)  . [DISCONTINUED] tamsulosin (FLOMAX) 0.4 MG CAPS capsule Take 1 capsule (0.4 mg total) by mouth daily. (Patient not taking: Reported on 02/15/2015)   No facility-administered encounter medications on file as of 11/20/2016.    No Known Allergies   Social History   Social History  . Marital status: Married    Spouse name: N/A  . Number of children: N/A  . Years of education: N/A   Occupational History  . Not on file.  Social History Main Topics  . Smoking status: Current Every Day Smoker    Packs/day: 0.50    Types: Cigarettes  . Smokeless tobacco: Current User    Types: Chew  . Alcohol use 10.8 oz/week    18 Cans of beer per week     Comment: daily  . Drug use: Yes    Types: Marijuana  . Sexual activity: Yes    Partners: Male    Birth control/ protection: None   Other Topics Concern  . Not on file   Social History Narrative  . No narrative on file   Family History  Problem Relation Age of Onset  . Nephrolithiasis  Father   . Cancer Father        Near Brain  . Factor V Leiden deficiency Mother   . Hypertension Maternal Aunt   . Diabetes Maternal Aunt   . Heart attack Maternal Aunt   . Hypertension Maternal Uncle   . Diabetes Maternal Uncle   . Heart disease Paternal Aunt   . Hypertension Maternal Grandmother   . Varicose Veins Maternal Grandmother   . Stroke Maternal Grandmother   . Cancer Paternal Grandmother        Liver  . Nephrolithiasis Paternal Grandmother   . Seizures Paternal Grandfather   . Prostate cancer Neg Hx   . Sickle cell trait Neg Hx   . Kidney cancer Neg Hx     Review of Systems  Constitutional: Negative.   Respiratory: Negative.   Cardiovascular: Negative.   Genitourinary: Positive for decreased urine volume, difficulty urinating, frequency and urgency. Negative for discharge, dysuria, enuresis, flank pain, genital sores, hematuria, penile pain, penile swelling, scrotal swelling and testicular pain.  Psychiatric/Behavioral: Negative.     Per HPI unless specifically indicated above     Objective:    BP 139/90 (BP Location: Left Arm, Patient Position: Sitting, Cuff Size: Normal)   Pulse 93   Temp 97.6 F (36.4 C)   Ht 5' 11.3" (1.811 m)   Wt 182 lb 3 oz (82.6 kg)   SpO2 98%   BMI 25.20 kg/m   Wt Readings from Last 3 Encounters:  11/20/16 182 lb 3 oz (82.6 kg)  04/29/15 185 lb (83.9 kg)  02/15/15 183 lb 6.4 oz (83.2 kg)    Physical Exam  Constitutional: He is oriented to person, place, and time. He appears well-developed and well-nourished. No distress.  HENT:  Head: Normocephalic and atraumatic.  Right Ear: Hearing normal.  Left Ear: Hearing normal.  Nose: Nose normal.  Eyes: Conjunctivae and lids are normal. Right eye exhibits no discharge. Left eye exhibits no discharge. No scleral icterus.  Cardiovascular: Normal rate, regular rhythm, normal heart sounds and intact distal pulses.  Exam reveals no gallop and no friction rub.   No murmur  heard. Pulmonary/Chest: Effort normal and breath sounds normal. No respiratory distress. He has no wheezes. He has no rales. He exhibits no tenderness.  Musculoskeletal: Normal range of motion.  Neurological: He is alert and oriented to person, place, and time.  Skin: Skin is warm, dry and intact. No rash noted. He is not diaphoretic. No erythema. No pallor.  Psychiatric: He has a normal mood and affect. His speech is normal and behavior is normal. Judgment and thought content normal. Cognition and memory are normal.  Nursing note and vitals reviewed.   Results for orders placed or performed in visit on 01/04/15  Microscopic Examination  Result Value Ref Range   WBC, UA 0-5 0 -  5 /hpf   RBC, UA >30 (H) 0 - 2 /hpf   Epithelial Cells (non renal) 0-10 0 - 10 /hpf   Mucus, UA Present (A) Not Estab.   Bacteria, UA Few (A) None seen/Few  Urinalysis, Complete  Result Value Ref Range   Specific Gravity, UA 1.015 1.005 - 1.030   pH, UA 5.5 5.0 - 7.5   Color, UA Yellow Yellow   Appearance Ur Clear Clear   Leukocytes, UA Trace (A) Negative   Protein, UA Negative Negative/Trace   Glucose, UA Negative Negative   Ketones, UA Negative Negative   RBC, UA 3+ (A) Negative   Bilirubin, UA Negative Negative   Urobilinogen, Ur 0.2 0.2 - 1.0 mg/dL   Nitrite, UA Negative Negative   Microscopic Examination See below:       Assessment & Plan:   Problem List Items Addressed This Visit      Genitourinary   Kidney stone    No issues currently. Continue to monitor. Call with any concerns.       Relevant Orders   CBC with Differential/Platelet   Comprehensive metabolic panel   UA/M w/rflx Culture, Routine   BPH (benign prostatic hyperplasia) - Primary    Will start flomax. Call with any concerns. Recheck in about 4-6 weeks.       Relevant Medications   tamsulosin (FLOMAX) 0.4 MG CAPS capsule   Other Relevant Orders   Comprehensive metabolic panel   PSA    Other Visit Diagnoses     Immunization due       Tdap given today.   Screening for cholesterol level       Labs drawn today. Await results.    Relevant Orders   Lipid Panel w/o Chol/HDL Ratio   Overweight       Labs drawn today. Await results.    Relevant Orders   Comprehensive metabolic panel   TSH   Screening for HIV without presence of risk factors       Labs drawn today. Await results.    Relevant Orders   HIV antibody       Follow up plan: Return 4-6 weeks, for Physical/follow up BPH.

## 2016-11-20 NOTE — Patient Instructions (Signed)
Tdap Vaccine (Tetanus, Diphtheria and Pertussis): What You Need to Know 1. Why get vaccinated? Tetanus, diphtheria and pertussis are very serious diseases. Tdap vaccine can protect us from these diseases. And, Tdap vaccine given to pregnant women can protect newborn babies against pertussis. TETANUS (Lockjaw) is rare in the [place] today. It causes painful muscle tightening and stiffness, usually all over the body.  It can lead to tightening of muscles in the head and neck so you can't open your mouth, swallow, or sometimes even breathe. Tetanus kills about 1 out of 10 people who are infected even after receiving the best medical care.  DIPHTHERIA is also rare in the [place] today. It can cause a thick coating to form in the back of the throat.  It can lead to breathing problems, heart failure, paralysis, and death.  PERTUSSIS (Whooping Cough) causes severe coughing spells, which can cause difficulty breathing, vomiting and disturbed sleep.  It can also lead to weight loss, incontinence, and rib fractures. Up to 2 in 100 adolescents and 5 in 100 adults with pertussis are hospitalized or have complications, which could include pneumonia or death.  These diseases are caused by bacteria. Diphtheria and pertussis are spread from person to person through secretions from coughing or sneezing. Tetanus enters the body through cuts, scratches, or wounds. Before vaccines, as many as 200,000 cases of diphtheria, 200,000 cases of pertussis, and hundreds of cases of tetanus, were reported in the [place] each year. Since vaccination began, reports of cases for tetanus and diphtheria have dropped by about 99% and for pertussis by about 80%. 2. Tdap vaccine Tdap vaccine can protect adolescents and adults from tetanus, diphtheria, and pertussis. One dose of Tdap is routinely given at age 11 or 12. People who did not get Tdap at that age should get it as soon as possible. Tdap is especially  important for healthcare professionals and anyone having close contact with a baby younger than 12 months. Pregnant women should get a dose of Tdap during every pregnancy, to protect the newborn from pertussis. Infants are most at risk for severe, life-threatening complications from pertussis. Another vaccine, called Td, protects against tetanus and diphtheria, but not pertussis. A Td booster should be given every 10 years. Tdap may be given as one of these boosters if you have never gotten Tdap before. Tdap may also be given after a severe cut or burn to prevent tetanus infection. Your doctor or the person giving you the vaccine can give you more information. Tdap may safely be given at the same time as other vaccines. 3. Some people should not get this vaccine  A person who has ever had a life-threatening allergic reaction after a previous dose of any diphtheria, tetanus or pertussis containing vaccine, OR has a severe allergy to any part of this vaccine, should not get Tdap vaccine. Tell the person giving the vaccine about any severe allergies.  Anyone who had coma or long repeated seizures within 7 days after a childhood dose of DTP or DTaP, or a previous dose of Tdap, should not get Tdap, unless a cause other than the vaccine was found. They can still get Td.  Talk to your doctor if you: ? have seizures or another nervous system problem, ? had severe pain or swelling after any vaccine containing diphtheria, tetanus or pertussis, ? ever had a condition called Guillain-Barr Syndrome (GBS), ? aren't feeling well on the day the shot is scheduled. 4. Risks With any medicine, including   vaccines, there is a chance of side effects. These are usually mild and go away on their own. Serious reactions are also possible but are rare. Most people who get Tdap vaccine do not have any problems with it. Mild problems following Tdap: (Did not interfere with activities)  Pain where the shot was given (about  3 in 4 adolescents or 2 in 3 adults)  Redness or swelling where the shot was given (about 1 person in 5)  Mild fever of at least 100.4F (up to about 1 in 25 adolescents or 1 in 100 adults)  Headache (about 3 or 4 people in 10)  Tiredness (about 1 person in 3 or 4)  Nausea, vomiting, diarrhea, stomach ache (up to 1 in 4 adolescents or 1 in 10 adults)  Chills, sore joints (about 1 person in 10)  Body aches (about 1 person in 3 or 4)  Rash, swollen glands (uncommon)  Moderate problems following Tdap: (Interfered with activities, but did not require medical attention)  Pain where the shot was given (up to 1 in 5 or 6)  Redness or swelling where the shot was given (up to about 1 in 16 adolescents or 1 in 12 adults)  Fever over 102F (about 1 in 100 adolescents or 1 in 250 adults)  Headache (about 1 in 7 adolescents or 1 in 10 adults)  Nausea, vomiting, diarrhea, stomach ache (up to 1 or 3 people in 100)  Swelling of the entire arm where the shot was given (up to about 1 in 500).  Severe problems following Tdap: (Unable to perform usual activities; required medical attention)  Swelling, severe pain, bleeding and redness in the arm where the shot was given (rare).  Problems that could happen after any vaccine:  People sometimes faint after a medical procedure, including vaccination. Sitting or lying down for about 15 minutes can help prevent fainting, and injuries caused by a fall. Tell your doctor if you feel dizzy, or have vision changes or ringing in the ears.  Some people get severe pain in the shoulder and have difficulty moving the arm where a shot was given. This happens very rarely.  Any medication can cause a severe allergic reaction. Such reactions from a vaccine are very rare, estimated at fewer than 1 in a million doses, and would happen within a few minutes to a few hours after the vaccination. As with any medicine, there is a very remote chance of a vaccine  causing a serious injury or death. The safety of vaccines is always being monitored. For more information, visit: www.cdc.gov/vaccinesafety/ 5. What if there is a serious problem? What should I look for? Look for anything that concerns you, such as signs of a severe allergic reaction, very high fever, or unusual behavior. Signs of a severe allergic reaction can include hives, swelling of the face and throat, difficulty breathing, a fast heartbeat, dizziness, and weakness. These would usually start a few minutes to a few hours after the vaccination. What should I do?  If you think it is a severe allergic reaction or other emergency that can't wait, call 9-1-1 or get the person to the nearest hospital. Otherwise, call your doctor.  Afterward, the reaction should be reported to the Vaccine Adverse Event Reporting System (VAERS). Your doctor might file this report, or you can do it yourself through the VAERS web site at www.vaers.hhs.gov, or by calling 1-800-822-7967. ? VAERS does not give medical advice. 6. The National Vaccine Injury Compensation Program The National   Vaccine Injury Compensation Program (VICP) is a federal program that was created to compensate people who may have been injured by certain vaccines. Persons who believe they may have been injured by a vaccine can learn about the program and about filing a claim by calling 1-800-338-2382 or visiting the VICP website at www.hrsa.gov/vaccinecompensation. There is a time limit to file a claim for compensation. 7. How can I learn more?  Ask your doctor. He or she can give you the vaccine package insert or suggest other sources of information.  Call your local or state health department.  Contact the Centers for Disease Control and Prevention (CDC): ? Call 1-800-232-4636 (1-800-CDC-INFO) or ? Visit CDC's website at www.cdc.gov/vaccines CDC Tdap Vaccine VIS (04/11/13) This information is not intended to replace advice given to you by your  health care provider. Make sure you discuss any questions you have with your health care provider. Document Released: 08/04/2011 Document Revised: 10/24/2015 Document Reviewed: 10/24/2015 Elsevier Interactive Patient Education  2017 [employer].  

## 2016-11-21 LAB — LIPID PANEL W/O CHOL/HDL RATIO
CHOLESTEROL TOTAL: 207 mg/dL — AB (ref 100–199)
HDL: 39 mg/dL — AB (ref >39)
LDL CALC: 135 mg/dL — AB (ref 0–99)
Triglycerides: 165 mg/dL — ABNORMAL HIGH (ref 0–149)
VLDL CHOLESTEROL CAL: 33 mg/dL (ref 5–40)

## 2016-11-21 LAB — CBC WITH DIFFERENTIAL/PLATELET
BASOS ABS: 0 10*3/uL (ref 0.0–0.2)
Basos: 0 %
EOS (ABSOLUTE): 0.1 10*3/uL (ref 0.0–0.4)
Eos: 1 %
HEMOGLOBIN: 18.5 g/dL — AB (ref 13.0–17.7)
Hematocrit: 52.2 % — ABNORMAL HIGH (ref 37.5–51.0)
IMMATURE GRANS (ABS): 0 10*3/uL (ref 0.0–0.1)
Immature Granulocytes: 0 %
LYMPHS: 26 %
Lymphocytes Absolute: 1.5 10*3/uL (ref 0.7–3.1)
MCH: 32.9 pg (ref 26.6–33.0)
MCHC: 35.4 g/dL (ref 31.5–35.7)
MCV: 93 fL (ref 79–97)
MONOCYTES: 9 %
Monocytes Absolute: 0.5 10*3/uL (ref 0.1–0.9)
NEUTROS PCT: 64 %
Neutrophils Absolute: 3.7 10*3/uL (ref 1.4–7.0)
PLATELETS: 173 10*3/uL (ref 150–379)
RBC: 5.62 x10E6/uL (ref 4.14–5.80)
RDW: 14.2 % (ref 12.3–15.4)
WBC: 5.8 10*3/uL (ref 3.4–10.8)

## 2016-11-21 LAB — COMPREHENSIVE METABOLIC PANEL
ALBUMIN: 4.5 g/dL (ref 3.5–5.5)
ALK PHOS: 91 IU/L (ref 39–117)
ALT: 21 IU/L (ref 0–44)
AST: 21 IU/L (ref 0–40)
Albumin/Globulin Ratio: 1.8 (ref 1.2–2.2)
BUN / CREAT RATIO: 15 (ref 9–20)
BUN: 15 mg/dL (ref 6–24)
Bilirubin Total: 0.6 mg/dL (ref 0.0–1.2)
CO2: 23 mmol/L (ref 20–29)
CREATININE: 1.03 mg/dL (ref 0.76–1.27)
Calcium: 9.6 mg/dL (ref 8.7–10.2)
Chloride: 102 mmol/L (ref 96–106)
GFR calc Af Amer: 97 mL/min/{1.73_m2} (ref >59)
GFR calc non Af Amer: 84 mL/min/{1.73_m2} (ref >59)
GLUCOSE: 83 mg/dL (ref 65–99)
Globulin, Total: 2.5 g/dL (ref 1.5–4.5)
Potassium: 4.4 mmol/L (ref 3.5–5.2)
Sodium: 139 mmol/L (ref 134–144)
TOTAL PROTEIN: 7 g/dL (ref 6.0–8.5)

## 2016-11-21 LAB — PSA: Prostate Specific Ag, Serum: 2 ng/mL (ref 0.0–4.0)

## 2016-11-21 LAB — HIV ANTIBODY (ROUTINE TESTING W REFLEX): HIV Screen 4th Generation wRfx: NONREACTIVE

## 2016-11-21 LAB — TSH: TSH: 1.54 u[IU]/mL (ref 0.450–4.500)

## 2016-11-23 ENCOUNTER — Encounter: Payer: Self-pay | Admitting: Family Medicine

## 2017-01-01 ENCOUNTER — Encounter: Payer: BLUE CROSS/BLUE SHIELD | Admitting: Family Medicine

## 2017-04-30 ENCOUNTER — Ambulatory Visit (INDEPENDENT_AMBULATORY_CARE_PROVIDER_SITE_OTHER): Payer: BLUE CROSS/BLUE SHIELD | Admitting: Family Medicine

## 2017-04-30 ENCOUNTER — Encounter: Payer: Self-pay | Admitting: Family Medicine

## 2017-04-30 VITALS — BP 146/98 | HR 86 | Temp 98.7°F | Wt 182.4 lb

## 2017-04-30 DIAGNOSIS — B351 Tinea unguium: Secondary | ICD-10-CM

## 2017-04-30 DIAGNOSIS — Z0001 Encounter for general adult medical examination with abnormal findings: Secondary | ICD-10-CM | POA: Diagnosis not present

## 2017-04-30 DIAGNOSIS — R350 Frequency of micturition: Secondary | ICD-10-CM | POA: Diagnosis not present

## 2017-04-30 DIAGNOSIS — N200 Calculus of kidney: Secondary | ICD-10-CM

## 2017-04-30 DIAGNOSIS — N401 Enlarged prostate with lower urinary tract symptoms: Secondary | ICD-10-CM | POA: Diagnosis not present

## 2017-04-30 DIAGNOSIS — Z1211 Encounter for screening for malignant neoplasm of colon: Secondary | ICD-10-CM | POA: Diagnosis not present

## 2017-04-30 DIAGNOSIS — Z0000 Encounter for general adult medical examination without abnormal findings: Secondary | ICD-10-CM

## 2017-04-30 LAB — UA/M W/RFLX CULTURE, ROUTINE
Bilirubin, UA: NEGATIVE
Glucose, UA: NEGATIVE
Ketones, UA: NEGATIVE
LEUKOCYTES UA: NEGATIVE
NITRITE UA: NEGATIVE
Protein, UA: NEGATIVE
RBC, UA: NEGATIVE
Specific Gravity, UA: 1.02 (ref 1.005–1.030)
Urobilinogen, Ur: 0.2 mg/dL (ref 0.2–1.0)
pH, UA: 6 (ref 5.0–7.5)

## 2017-04-30 MED ORDER — CICLOPIROX 8 % EX SOLN: CUTANEOUS | 3 refills | Status: DC

## 2017-04-30 NOTE — Assessment & Plan Note (Signed)
UA negative. No pain. Call with any concerns.
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
## 2017-04-30 NOTE — Patient Instructions (Addendum)

## 2017-04-30 NOTE — Assessment & Plan Note (Signed)
UA negative. No pain. Call with any concerns. Checking PSA today.

## 2017-05-01 LAB — COMPREHENSIVE METABOLIC PANEL
ALBUMIN: 4.1 g/dL (ref 3.5–5.5)
ALK PHOS: 84 IU/L (ref 39–117)
ALT: 18 IU/L (ref 0–44)
AST: 18 IU/L (ref 0–40)
Albumin/Globulin Ratio: 1.9 (ref 1.2–2.2)
BUN/Creatinine Ratio: 13 (ref 9–20)
BUN: 12 mg/dL (ref 6–24)
Bilirubin Total: 0.7 mg/dL (ref 0.0–1.2)
CALCIUM: 9.3 mg/dL (ref 8.7–10.2)
CO2: 22 mmol/L (ref 20–29)
CREATININE: 0.96 mg/dL (ref 0.76–1.27)
Chloride: 103 mmol/L (ref 96–106)
GFR calc Af Amer: 105 mL/min/{1.73_m2} (ref >59)
GFR calc non Af Amer: 91 mL/min/{1.73_m2} (ref >59)
Globulin, Total: 2.2 g/dL (ref 1.5–4.5)
Glucose: 100 mg/dL — ABNORMAL HIGH (ref 65–99)
Potassium: 3.8 mmol/L (ref 3.5–5.2)
Sodium: 140 mmol/L (ref 134–144)
Total Protein: 6.3 g/dL (ref 6.0–8.5)

## 2017-05-01 LAB — CBC WITH DIFFERENTIAL/PLATELET
BASOS ABS: 0 10*3/uL (ref 0.0–0.2)
Basos: 1 %
EOS (ABSOLUTE): 0.1 10*3/uL (ref 0.0–0.4)
EOS: 2 %
HEMOGLOBIN: 17.3 g/dL (ref 13.0–17.7)
Hematocrit: 50.9 % (ref 37.5–51.0)
IMMATURE GRANULOCYTES: 0 %
Immature Grans (Abs): 0 10*3/uL (ref 0.0–0.1)
LYMPHS ABS: 1.4 10*3/uL (ref 0.7–3.1)
Lymphs: 36 %
MCH: 32.3 pg (ref 26.6–33.0)
MCHC: 34 g/dL (ref 31.5–35.7)
MCV: 95 fL (ref 79–97)
MONOCYTES: 11 %
Monocytes Absolute: 0.4 10*3/uL (ref 0.1–0.9)
NEUTROS PCT: 50 %
Neutrophils Absolute: 2 10*3/uL (ref 1.4–7.0)
Platelets: 156 10*3/uL (ref 150–379)
RBC: 5.36 x10E6/uL (ref 4.14–5.80)
RDW: 13.6 % (ref 12.3–15.4)
WBC: 3.9 10*3/uL (ref 3.4–10.8)

## 2017-05-01 LAB — PSA: Prostate Specific Ag, Serum: 1.5 ng/mL (ref 0.0–4.0)

## 2017-05-01 LAB — LIPID PANEL W/O CHOL/HDL RATIO
CHOLESTEROL TOTAL: 158 mg/dL (ref 100–199)
HDL: 38 mg/dL — ABNORMAL LOW (ref >39)
LDL CALC: 107 mg/dL — AB (ref 0–99)
TRIGLYCERIDES: 66 mg/dL (ref 0–149)
VLDL Cholesterol Cal: 13 mg/dL (ref 5–40)

## 2017-05-01 LAB — TSH: TSH: 1.34 u[IU]/mL (ref 0.450–4.500)

## 2017-05-03 ENCOUNTER — Encounter: Payer: Self-pay | Admitting: Family Medicine

## 2018-03-07 ENCOUNTER — Encounter: Payer: Self-pay | Admitting: Family Medicine

## 2018-05-06 ENCOUNTER — Encounter: Payer: BLUE CROSS/BLUE SHIELD | Admitting: Family Medicine

## 2018-05-16 ENCOUNTER — Telehealth: Payer: Self-pay | Admitting: Family Medicine

## 2018-05-16 ENCOUNTER — Other Ambulatory Visit: Payer: Self-pay

## 2018-05-16 ENCOUNTER — Ambulatory Visit (INDEPENDENT_AMBULATORY_CARE_PROVIDER_SITE_OTHER): Payer: Self-pay | Admitting: Family Medicine

## 2018-05-16 ENCOUNTER — Encounter: Payer: Self-pay | Admitting: Family Medicine

## 2018-05-16 VITALS — BP 148/108 | HR 73

## 2018-05-16 DIAGNOSIS — I10 Essential (primary) hypertension: Secondary | ICD-10-CM

## 2018-05-16 MED ORDER — HYDROCHLOROTHIAZIDE 25 MG PO TABS: 25.0000 mg | ORAL_TABLET | Freq: Every day | ORAL | 3 refills | Status: DC

## 2018-05-16 NOTE — Patient Instructions (Signed)
DASH Eating Plan  DASH stands for "Dietary Approaches to Stop Hypertension." The DASH eating plan is a healthy eating plan that has been shown to reduce high blood pressure (hypertension). It may also reduce your risk for type 2 diabetes, heart disease, and stroke. The DASH eating plan may also help with weight loss.  What are tips for following this plan?    General guidelines   Avoid eating more than 2,300 mg (milligrams) of salt (sodium) a day. If you have hypertension, you may need to reduce your sodium intake to 1,500 mg a day.   Limit alcohol intake to no more than 1 drink a day for nonpregnant women and 2 drinks a day for men. One drink equals 12 oz of beer, 5 oz of wine, or 1 oz of hard liquor.   Work with your health care provider to maintain a healthy body weight or to lose weight. Ask what an ideal weight is for you.   Get at least 30 minutes of exercise that causes your heart to beat faster (aerobic exercise) most days of the week. Activities may include walking, swimming, or biking.   Work with your health care provider or diet and nutrition specialist (dietitian) to adjust your eating plan to your individual calorie needs.  Reading food labels     Check food labels for the amount of sodium per serving. Choose foods with less than 5 percent of the Daily Value of sodium. Generally, foods with less than 300 mg of sodium per serving fit into this eating plan.   To find whole grains, look for the word "whole" as the first word in the ingredient list.  Shopping   Buy products labeled as "low-sodium" or "no salt added."   Buy fresh foods. Avoid canned foods and premade or frozen meals.  Cooking   Avoid adding salt when cooking. Use salt-free seasonings or herbs instead of table salt or sea salt. Check with your health care provider or pharmacist before using salt substitutes.   Do not fry foods. Cook foods using healthy methods such as baking, boiling, grilling, and broiling instead.   Cook with  heart-healthy oils, such as olive, canola, soybean, or sunflower oil.  Meal planning   Eat a balanced diet that includes:  ? 5 or more servings of fruits and vegetables each day. At each meal, try to fill half of your plate with fruits and vegetables.  ? Up to 6-8 servings of whole grains each day.  ? Less than 6 oz of lean meat, poultry, or fish each day. A 3-oz serving of meat is about the same size as a deck of cards. One egg equals 1 oz.  ? 2 servings of low-fat dairy each day.  ? A serving of nuts, seeds, or beans 5 times each week.  ? Heart-healthy fats. Healthy fats called Omega-3 fatty acids are found in foods such as flaxseeds and coldwater fish, like sardines, salmon, and mackerel.   Limit how much you eat of the following:  ? Canned or prepackaged foods.  ? Food that is high in trans fat, such as fried foods.  ? Food that is high in saturated fat, such as fatty meat.  ? Sweets, desserts, sugary drinks, and other foods with added sugar.  ? Full-fat dairy products.   Do not salt foods before eating.   Try to eat at least 2 vegetarian meals each week.   Eat more home-cooked food and less restaurant, buffet, and fast food.     When eating at a restaurant, ask that your food be prepared with less salt or no salt, if possible.  What foods are recommended?  The items listed may not be a complete list. Talk with your dietitian about what dietary choices are best for you.  Grains  Whole-grain or whole-wheat bread. Whole-grain or whole-wheat pasta. Brown rice. Oatmeal. Quinoa. Bulgur. Whole-grain and low-sodium cereals. Pita bread. Low-fat, low-sodium crackers. Whole-wheat flour tortillas.  Vegetables  Fresh or frozen vegetables (raw, steamed, roasted, or grilled). Low-sodium or reduced-sodium tomato and vegetable juice. Low-sodium or reduced-sodium tomato sauce and tomato paste. Low-sodium or reduced-sodium canned vegetables.  Fruits  All fresh, dried, or frozen fruit. Canned fruit in natural juice (without  added sugar).  Meat and other protein foods  Skinless chicken or turkey. Ground chicken or turkey. Pork with fat trimmed off. Fish and seafood. Egg whites. Dried beans, peas, or lentils. Unsalted nuts, nut butters, and seeds. Unsalted canned beans. Lean cuts of beef with fat trimmed off. Low-sodium, lean deli meat.  Dairy  Low-fat (1%) or fat-free (skim) milk. Fat-free, low-fat, or reduced-fat cheeses. Nonfat, low-sodium ricotta or cottage cheese. Low-fat or nonfat yogurt. Low-fat, low-sodium cheese.  Fats and oils  Soft margarine without trans fats. Vegetable oil. Low-fat, reduced-fat, or light mayonnaise and salad dressings (reduced-sodium). Canola, safflower, olive, soybean, and sunflower oils. Avocado.  Seasoning and other foods  Herbs. Spices. Seasoning mixes without salt. Unsalted popcorn and pretzels. Fat-free sweets.  What foods are not recommended?  The items listed may not be a complete list. Talk with your dietitian about what dietary choices are best for you.  Grains  Baked goods made with fat, such as croissants, muffins, or some breads. Dry pasta or rice meal packs.  Vegetables  Creamed or fried vegetables. Vegetables in a cheese sauce. Regular canned vegetables (not low-sodium or reduced-sodium). Regular canned tomato sauce and paste (not low-sodium or reduced-sodium). Regular tomato and vegetable juice (not low-sodium or reduced-sodium). Pickles. Olives.  Fruits  Canned fruit in a light or heavy syrup. Fried fruit. Fruit in cream or butter sauce.  Meat and other protein foods  Fatty cuts of meat. Ribs. Fried meat. Bacon. Sausage. Bologna and other processed lunch meats. Salami. Fatback. Hotdogs. Bratwurst. Salted nuts and seeds. Canned beans with added salt. Canned or smoked fish. Whole eggs or egg yolks. Chicken or turkey with skin.  Dairy  Whole or 2% milk, cream, and half-and-half. Whole or full-fat cream cheese. Whole-fat or sweetened yogurt. Full-fat cheese. Nondairy creamers. Whipped toppings.  Processed cheese and cheese spreads.  Fats and oils  Butter. Stick margarine. Lard. Shortening. Ghee. Bacon fat. Tropical oils, such as coconut, palm kernel, or palm oil.  Seasoning and other foods  Salted popcorn and pretzels. Onion salt, garlic salt, seasoned salt, table salt, and sea salt. Worcestershire sauce. Tartar sauce. Barbecue sauce. Teriyaki sauce. Soy sauce, including reduced-sodium. Steak sauce. Canned and packaged gravies. Fish sauce. Oyster sauce. Cocktail sauce. Horseradish that you find on the shelf. Ketchup. Mustard. Meat flavorings and tenderizers. Bouillon cubes. Hot sauce and Tabasco sauce. Premade or packaged marinades. Premade or packaged taco seasonings. Relishes. Regular salad dressings.  Where to find more information:   National Heart, Lung, and Blood Institute: www.nhlbi.nih.gov   American Heart Association: www.heart.org  Summary   The DASH eating plan is a healthy eating plan that has been shown to reduce high blood pressure (hypertension). It may also reduce your risk for type 2 diabetes, heart disease, and stroke.   With the   DASH eating plan, you should limit salt (sodium) intake to 2,300 mg a day. If you have hypertension, you may need to reduce your sodium intake to 1,500 mg a day.   When on the DASH eating plan, aim to eat more fresh fruits and vegetables, whole grains, lean proteins, low-fat dairy, and heart-healthy fats.   Work with your health care provider or diet and nutrition specialist (dietitian) to adjust your eating plan to your individual calorie needs.  This information is not intended to replace advice given to you by your health care provider. Make sure you discuss any questions you have with your health care provider.  Document Released: 01/22/2011 Document Revised: 01/27/2016 Document Reviewed: 01/27/2016  Elsevier Interactive Patient Education  2019 [employer]

## 2018-05-16 NOTE — Progress Notes (Signed)
BP (!) 148/108 Comment: patient reported- virtual  Pulse 73 Comment: patient reported- virtual   Subjective:    Patient ID: Nathaniel Howard, male    DOB: [birth year], 53 y.o.   MRN: [id number]  HPI: Nathaniel Howard is a 53 y.o. male  Chief Complaint  Patient presents with  . Hypertension    pt states his BP has been running a little high for the past week, seems higher in the morning.    ELEVATED BLOOD PRESSURE Duration of elevated BP: About a week BP monitoring frequency: daily BP range: high 140s-150s/100s Previous BP meds: no Recent stressors: yes Family history of hypertension: yes Recurrent headaches: no Visual changes: yes Palpitations: no  Dyspnea: no Chest pain: no Lower extremity edema: no Dizzy/lightheaded: no Transient ischemic attacks: no  Relevant past medical, surgical, family and social history reviewed and updated as indicated. Interim medical history since our last visit reviewed. Allergies and medications reviewed and updated.  Review of Systems  Constitutional: Negative.   Respiratory: Negative.   Cardiovascular: Negative.   Gastrointestinal: Negative.   Psychiatric/Behavioral: Negative.     Per HPI unless specifically indicated above     Objective:    BP (!) 148/108 Comment: patient reported- virtual  Pulse 73 Comment: patient reported- virtual  Wt Readings from Last 3 Encounters:  04/30/17 182 lb 6.4 oz (82.7 kg)  11/20/16 182 lb 3 oz (82.6 kg)  04/29/15 185 lb (83.9 kg)    Physical Exam Vitals signs and nursing note reviewed.  Constitutional:      General: He is not in acute distress.    Appearance: Normal appearance. He is not ill-appearing, toxic-appearing or diaphoretic.  HENT:     Head: Normocephalic and atraumatic.     Right Ear: External ear normal.     Left Ear: External ear normal.     Nose: Nose normal.     Mouth/Throat:     Mouth: Mucous membranes are moist.     Pharynx: Oropharynx is clear.  Eyes:     General: No  scleral icterus.       Right eye: No discharge.        Left eye: No discharge.     Conjunctiva/sclera: Conjunctivae normal.     Pupils: Pupils are equal, round, and reactive to light.  Neck:     Musculoskeletal: Normal range of motion.  Pulmonary:     Effort: Pulmonary effort is normal. No respiratory distress.     Comments: Speaking in full sentences Musculoskeletal: Normal range of motion.  Skin:    Coloration: Skin is not jaundiced or pale.     Findings: No bruising, erythema, lesion or rash.  Neurological:     Mental Status: He is alert and oriented to person, place, and time. Mental status is at baseline.  Psychiatric:        Mood and Affect: Mood normal.        Behavior: Behavior normal.        Thought Content: Thought content normal.        Judgment: Judgment normal.     Results for orders placed or performed in visit on 04/30/17  CBC with Differential/Platelet  Result Value Ref Range   WBC 3.9 3.4 - 10.8 x10E3/uL   RBC 5.36 4.14 - 5.80 x10E6/uL   Hemoglobin 17.3 13.0 - 17.7 g/dL   Hematocrit 33.2 95.1 - 51.0 %   MCV 95 79 - 97 fL   MCH 32.3 26.6 - 33.0 pg  MCHC 34.0 31.5 - 35.7 g/dL   RDW 16.1 09.6 - 04.5 %   Platelets 156 150 - 379 x10E3/uL   Neutrophils 50 Not Estab. %   Lymphs 36 Not Estab. %   Monocytes 11 Not Estab. %   Eos 2 Not Estab. %   Basos 1 Not Estab. %   Neutrophils Absolute 2.0 1.4 - 7.0 x10E3/uL   Lymphocytes Absolute 1.4 0.7 - 3.1 x10E3/uL   Monocytes Absolute 0.4 0.1 - 0.9 x10E3/uL   EOS (ABSOLUTE) 0.1 0.0 - 0.4 x10E3/uL   Basophils Absolute 0.0 0.0 - 0.2 x10E3/uL   Immature Granulocytes 0 Not Estab. %   Immature Grans (Abs) 0.0 0.0 - 0.1 x10E3/uL  Comprehensive metabolic panel  Result Value Ref Range   Glucose 100 (H) 65 - 99 mg/dL   BUN 12 6 - 24 mg/dL   Creatinine, Ser 4.09 0.76 - 1.27 mg/dL   GFR calc non Af Amer 91 >59 mL/min/1.73   GFR calc Af Amer 105 >59 mL/min/1.73   BUN/Creatinine Ratio 13 9 - 20   Sodium 140 134 - 144  mmol/L   Potassium 3.8 3.5 - 5.2 mmol/L   Chloride 103 96 - 106 mmol/L   CO2 22 20 - 29 mmol/L   Calcium 9.3 8.7 - 10.2 mg/dL   Total Protein 6.3 6.0 - 8.5 g/dL   Albumin 4.1 3.5 - 5.5 g/dL   Globulin, Total 2.2 1.5 - 4.5 g/dL   Albumin/Globulin Ratio 1.9 1.2 - 2.2   Bilirubin Total 0.7 0.0 - 1.2 mg/dL   Alkaline Phosphatase 84 39 - 117 IU/L   AST 18 0 - 40 IU/L   ALT 18 0 - 44 IU/L  Lipid Panel w/o Chol/HDL Ratio  Result Value Ref Range   Cholesterol, Total 158 100 - 199 mg/dL   Triglycerides 66 0 - 149 mg/dL   HDL 38 (L) >81 mg/dL   VLDL Cholesterol Cal 13 5 - 40 mg/dL   LDL Calculated 191 (H) 0 - 99 mg/dL  PSA  Result Value Ref Range   Prostate Specific Ag, Serum 1.5 0.0 - 4.0 ng/mL  TSH  Result Value Ref Range   TSH 1.340 0.450 - 4.500 uIU/mL  UA/M w/rflx Culture, Routine  Result Value Ref Range   Specific Gravity, UA 1.020 1.005 - 1.030   pH, UA 6.0 5.0 - 7.5   Color, UA Yellow Yellow   Appearance Ur Clear Clear   Leukocytes, UA Negative Negative   Protein, UA Negative Negative/Trace   Glucose, UA Negative Negative   Ketones, UA Negative Negative   RBC, UA Negative Negative   Bilirubin, UA Negative Negative   Urobilinogen, Ur 0.2 0.2 - 1.0 mg/dL   Nitrite, UA Negative Negative      Assessment & Plan:   Problem List Items Addressed This Visit      Cardiovascular and Mediastinum   Essential hypertension - Primary    Has been running a bit high. Offered DASH diet vs. HCTZ- he will do both. Rx given today, checking baseline BMP. Recheck BP 1 month. Call with any concerns.       Relevant Medications   hydrochlorothiazide (HYDRODIURIL) 25 MG tablet   Other Relevant Orders   Basic metabolic panel   Microalbumin, Urine Waived       Follow up plan: Return in about 4 weeks (around 06/13/2018) for Follow up BP.   Marland Kitchen This visit was completed via FaceTime due to the restrictions of the COVID-19 pandemic. All issues  as above were discussed and addressed. Physical  exam was done as above through visual confirmation on FaceTime. If it was felt that the patient should be evaluated in the office, they were directed there. The patient verbally consented to this visit. . Location of the patient: home . Location of the provider: home . Those involved with this call:  . Provider: Olevia Perches, DO . CMA: Wilhemena Durie, CMA . Front Desk/Registration: Adela Ports  . Time spent on call: 15 minutes with patient face to face via video conference. More than 50% of this time was spent in counseling and coordination of care.

## 2018-05-16 NOTE — Telephone Encounter (Signed)
Pt's wife Rayfield Citizen called in to reschedule physical, however she is very concerned because Havish's bp is very high he has no cold or uri symptoms. Please advise if he needs to come in tomorrow or if he is able to change physical to virtual visit they have iphones?

## 2018-05-16 NOTE — Telephone Encounter (Signed)
If he has a BP cuff- I can see him virtually. If he does not, I can see him for an office visit tomorrow.

## 2018-05-16 NOTE — Assessment & Plan Note (Signed)
Has been running a bit high. Offered DASH diet vs. HCTZ- he will do both. Rx given today, checking baseline BMP. Recheck BP 1 month. Call with any concerns.

## 2018-05-17 ENCOUNTER — Encounter: Payer: BLUE CROSS/BLUE SHIELD | Admitting: Family Medicine

## 2018-06-03 ENCOUNTER — Other Ambulatory Visit: Payer: Self-pay

## 2018-06-03 ENCOUNTER — Other Ambulatory Visit: Payer: BLUE CROSS/BLUE SHIELD

## 2018-06-03 DIAGNOSIS — I10 Essential (primary) hypertension: Secondary | ICD-10-CM | POA: Diagnosis not present

## 2018-06-03 LAB — MICROALBUMIN, URINE WAIVED
Creatinine, Urine Waived: 10 mg/dL (ref 10–300)
Microalb, Ur Waived: 10 mg/L (ref 0–19)
Microalb/Creat Ratio: <30 mg/g (ref <30)

## 2018-06-04 LAB — BASIC METABOLIC PANEL
BUN/Creatinine Ratio: 10 (ref 9–20)
BUN: 10 mg/dL (ref 6–24)
CO2: 25 mmol/L (ref 20–29)
Calcium: 9.6 mg/dL (ref 8.7–10.2)
Chloride: 95 mmol/L — ABNORMAL LOW (ref 96–106)
Creatinine, Ser: 1.04 mg/dL (ref 0.76–1.27)
GFR calc Af Amer: 94 mL/min/{1.73_m2} (ref >59)
GFR calc non Af Amer: 82 mL/min/{1.73_m2} (ref >59)
Glucose: 80 mg/dL (ref 65–99)
Potassium: 4.2 mmol/L (ref 3.5–5.2)
Sodium: 138 mmol/L (ref 134–144)

## 2018-11-19 ENCOUNTER — Ambulatory Visit: Payer: BLUE CROSS/BLUE SHIELD

## 2019-02-08 ENCOUNTER — Ambulatory Visit
Admission: RE | Admit: 2019-02-08 | Discharge: 2019-02-08 | Disposition: A | Discharge status: Home / Self Care | Payer: BC Managed Care – PPO | Attending: Family Medicine | Admitting: Family Medicine

## 2019-02-08 ENCOUNTER — Ambulatory Visit
Admission: RE | Admit: 2019-02-08 | Discharge: 2019-02-08 | Disposition: A | Discharge status: Home / Self Care | Payer: BC Managed Care – PPO | Source: Ambulatory Visit | Attending: Family Medicine | Admitting: Family Medicine

## 2019-02-08 ENCOUNTER — Other Ambulatory Visit: Payer: Self-pay

## 2019-02-08 ENCOUNTER — Encounter: Payer: Self-pay | Admitting: Family Medicine

## 2019-02-08 ENCOUNTER — Ambulatory Visit: Payer: BC Managed Care – PPO | Admitting: Family Medicine

## 2019-02-08 VITALS — BP 131/94 | HR 76 | Temp 98.1°F | Ht 72.0 in | Wt 183.0 lb

## 2019-02-08 DIAGNOSIS — G8929 Other chronic pain: Secondary | ICD-10-CM | POA: Insufficient documentation

## 2019-02-08 DIAGNOSIS — Z1211 Encounter for screening for malignant neoplasm of colon: Secondary | ICD-10-CM

## 2019-02-08 DIAGNOSIS — M25511 Pain in right shoulder: Secondary | ICD-10-CM | POA: Insufficient documentation

## 2019-02-08 DIAGNOSIS — Z23 Encounter for immunization: Secondary | ICD-10-CM

## 2019-02-08 MED ORDER — PREDNISONE 10 MG PO TABS: ORAL_TABLET | ORAL | 0 refills | Status: DC

## 2019-02-08 NOTE — Progress Notes (Signed)
BP (!) 131/94   Pulse 76   Temp 98.1 F (36.7 C) (Oral)   Ht 6' (1.829 m)   Wt 183 lb (83 kg)   SpO2 98%   BMI 24.82 kg/m    Subjective:    Patient ID: Nathaniel Howard, male    DOB: Apr 04, 1965, 53 y.o.   MRN: [id number]  HPI: Nathaniel Howard is a 53 y.o. male  Chief Complaint  Patient presents with  . Shoulder Pain    right side for a few months. states pain is getting worse   Right shoulder pain the past few months, strong aching that's constant but worse with reaching behind. Getting worse over time. Taking ibuprofen and tylenol at times which eases it off. Does a lot of lifting and twisting with his job but no obvious injury that started this. No numbness or tingling, but does note a little weakness mostly favoring it due to pain. No hx of shoulder injuries in the past.   Relevant past medical, surgical, family and social history reviewed and updated as indicated. Interim medical history since our last visit reviewed. Allergies and medications reviewed and updated.  Review of Systems  Per HPI unless specifically indicated above     Objective:    BP (!) 131/94   Pulse 76   Temp 98.1 F (36.7 C) (Oral)   Ht 6' (1.829 m)   Wt 183 lb (83 kg)   SpO2 98%   BMI 24.82 kg/m   Wt Readings from Last 3 Encounters:  02/08/19 183 lb (83 kg)  04/30/17 182 lb 6.4 oz (82.7 kg)  11/20/16 182 lb 3 oz (82.6 kg)    Physical Exam Vitals and nursing note reviewed.  Constitutional:      Appearance: Normal appearance.  HENT:     Head: Atraumatic.  Eyes:     Extraocular Movements: Extraocular movements intact.     Conjunctiva/sclera: Conjunctivae normal.  Cardiovascular:     Rate and Rhythm: Normal rate and regular rhythm.     Pulses: Normal pulses.  Pulmonary:     Effort: Pulmonary effort is normal.     Breath sounds: Normal breath sounds.  Musculoskeletal:        General: No swelling or tenderness. Normal range of motion.     Cervical back: Normal range of motion and neck  supple.     Comments: Resistance with reach behind with right arm, strength intact b/l UEs  Skin:    General: Skin is warm and dry.  Neurological:     General: No focal deficit present.     Mental Status: He is oriented to person, place, and time.     Sensory: No sensory deficit.     Motor: No weakness.  Psychiatric:        Mood and Affect: Mood normal.        Thought Content: Thought content normal.        Judgment: Judgment normal.     Results for orders placed or performed in visit on 06/03/18  Microalbumin, Urine Waived  Result Value Ref Range   Microalb, Ur Waived 10 0 - 19 mg/L   Creatinine, Urine Waived 10 10 - 300 mg/dL   Microalb/Creat Ratio <30 <30 mg/g  Basic metabolic panel  Result Value Ref Range   Glucose 80 65 - 99 mg/dL   BUN 10 6 - 24 mg/dL   Creatinine, Ser 5.10 0.76 - 1.27 mg/dL   GFR calc non Af Amer 82 >59  mL/min/1.73   GFR calc Af Amer 94 >59 mL/min/1.73   BUN/Creatinine Ratio 10 9 - 20   Sodium 138 134 - 144 mmol/L   Potassium 4.2 3.5 - 5.2 mmol/L   Chloride 95 (L) 96 - 106 mmol/L   CO2 25 20 - 29 mmol/L   Calcium 9.6 8.7 - 10.2 mg/dL      Assessment & Plan:   Problem List Items Addressed This Visit    None    Visit Diagnoses    Flu vaccine need    -  Primary   Relevant Orders   Flu Vaccine QUAD 6+ mos PF IM (Fluarix Quad PF) (Completed)   Colon cancer screening       Relevant Orders   Ambulatory referral to Gastroenterology   Chronic right shoulder pain       Obtain x-ray, trial prednisone, stretches, heat, NSAIDs/tylenol prn. Orthopedic referral if not improving for further eval   Relevant Medications   predniSONE (DELTASONE) 10 MG tablet   Other Relevant Orders   DG Shoulder Right (Completed)       Follow up plan: Return in about 2 weeks (around 02/22/2019) for CPE with dr Lenna Sciara.

## 2019-02-14 ENCOUNTER — Other Ambulatory Visit: Payer: Self-pay

## 2019-02-14 DIAGNOSIS — Z1211 Encounter for screening for malignant neoplasm of colon: Secondary | ICD-10-CM

## 2019-03-21 ENCOUNTER — Other Ambulatory Visit: Payer: Self-pay

## 2019-03-21 ENCOUNTER — Ambulatory Visit (INDEPENDENT_AMBULATORY_CARE_PROVIDER_SITE_OTHER): Payer: BC Managed Care – PPO | Admitting: Family Medicine

## 2019-03-21 ENCOUNTER — Encounter: Payer: Self-pay | Admitting: Family Medicine

## 2019-03-21 VITALS — BP 136/89 | HR 90 | Temp 98.0°F | Ht 71.65 in | Wt 181.1 lb

## 2019-03-21 DIAGNOSIS — N401 Enlarged prostate with lower urinary tract symptoms: Secondary | ICD-10-CM

## 2019-03-21 DIAGNOSIS — Z23 Encounter for immunization: Secondary | ICD-10-CM

## 2019-03-21 DIAGNOSIS — Z0000 Encounter for general adult medical examination without abnormal findings: Secondary | ICD-10-CM

## 2019-03-21 DIAGNOSIS — R350 Frequency of micturition: Secondary | ICD-10-CM

## 2019-03-21 DIAGNOSIS — I10 Essential (primary) hypertension: Secondary | ICD-10-CM

## 2019-03-21 NOTE — Assessment & Plan Note (Signed)
Under good control off medicine. Continue to monitor. Rechecking labs today. Call with any concerns.

## 2019-03-21 NOTE — Assessment & Plan Note (Signed)
Under good control off medicine. Continue to monitor. Rechecking labs today. Call with any concerns.  

## 2019-03-21 NOTE — Progress Notes (Signed)
BP 136/89   Pulse 90   Temp 98 F (36.7 C)   Ht 5' 11.65" (1.82 m)   Wt 181 lb 2 oz (82.2 kg)   SpO2 99%   BMI 24.80 kg/m    Subjective:    Patient ID: Nathaniel Howard, male    DOB: [birth year], 54 y.o.   MRN: [id number]  HPI: Nathaniel Howard is a 54 y.o. male presenting on 03/21/2019 for comprehensive medical examination. Current medical complaints include:  HYPERTENSION-stopped taking his HCTZ several months ago.  Hypertension status: stable  Satisfied with current treatment? yes Duration of hypertension: chronic BP monitoring frequency:  rarely BP medication side effects:  no Medication compliance: poor compliance Previous BP meds: HCTZ Aspirin: no Recurrent headaches: no Visual changes: no Palpitations: no Dyspnea: no Chest pain: no Lower extremity edema: no Dizzy/lightheaded: no  BPH BPH status: stable Satisfied with current treatment?: yes Duration: chronic Nocturia: no Urinary frequency:no Incomplete voiding: no Urgency: no Weak urinary stream: no Straining to start stream: no Dysuria: no Onset: gradual Severity: mild  Interim Problems from his last visit: no  Depression Screen done today and results listed below:  Depression screen [facility] 2/9 02/08/2019 04/30/2017 11/20/2016  Decreased Interest 0 0 0  Down, Depressed, Hopeless 0 0 1  PHQ - 2 Score 0 0 1  Altered sleeping 0 - -  Tired, decreased energy 1 - -  Change in appetite 0 - -  Feeling bad or failure about yourself  0 - -  Trouble concentrating 0 - -  Moving slowly or fidgety/restless 0 - -  Suicidal thoughts 0 - -  PHQ-9 Score 1 - -     Past Medical History:  Past Medical History:  Diagnosis Date  . Kidney stone   . Low back pain     Surgical History:  Past Surgical History:  Procedure Laterality Date  . EXTRACORPOREAL SHOCK WAVE LITHOTRIPSY Right 12/06/2014   Procedure: EXTRACORPOREAL SHOCK WAVE LITHOTRIPSY (ESWL);  Surgeon: Collier Flowers, MD;  Location: [facility] ORS;  Service:  Urology;  Laterality: Right;  . LITHOTRIPSY Right     Medications:  Current Outpatient Medications on File Prior to Visit  Medication Sig  . acetaminophen (TYLENOL) 325 MG tablet Take 325 mg by mouth every 6 (six) hours as needed. Reported on 02/15/2015  . fluticasone (FLONASE) 50 MCG/ACT nasal spray Place 2 sprays into both nostrils daily.  Marland Kitchen ibuprofen (ADVIL,MOTRIN) 200 MG tablet Take 200 mg by mouth every 6 (six) hours as needed. Reported on 02/15/2015  . predniSONE (DELTASONE) 10 MG tablet Take 6 tabs day one, 5 tabs day two, 4 tabs day three, etc   No current facility-administered medications on file prior to visit.    Allergies:  No Known Allergies  Social History:  Social History   Socioeconomic History  . Marital status: Married    Spouse name: Not on file  . Number of children: Not on file  . Years of education: Not on file  . Highest education level: Not on file  Occupational History  . Not on file  Tobacco Use  . Smoking status: Current Every Day Smoker    Packs/day: 0.50    Types: Cigarettes  . Smokeless tobacco: Former Systems developer    Types: Chew  Substance and Sexual Activity  . Alcohol use: Yes    Alcohol/week: 18.0 standard drinks    Types: 18 Cans of beer per week    Comment: daily  . Drug use: Yes  Types: Marijuana  . Sexual activity: Yes    Partners: Male    Birth control/protection: None  Other Topics Concern  . Not on file  Social History Narrative  . Not on file   Social Determinants of Health   Financial Resource Strain:   . Difficulty of Paying Living Expenses: Not on file  Food Insecurity:   . Worried About Programme researcher, broadcasting/film/videounning Out of Food in the Last Year: Not on file  . Ran Out of Food in the Last Year: Not on file  Transportation Needs:   . Lack of Transportation (Medical): Not on file  . Lack of Transportation (Non-Medical): Not on file  Physical Activity:   . Days of Exercise per Week: Not on file  . Minutes of Exercise per Session: Not on file   Stress:   . Feeling of Stress : Not on file  Social Connections:   . Frequency of Communication with Friends and Family: Not on file  . Frequency of Social Gatherings with Friends and Family: Not on file  . Attends Religious Services: Not on file  . Active Member of Clubs or Organizations: Not on file  . Attends BankerClub or Organization Meetings: Not on file  . Marital Status: Not on file  Intimate Partner Violence:   . Fear of Current or Ex-Partner: Not on file  . Emotionally Abused: Not on file  . Physically Abused: Not on file  . Sexually Abused: Not on file   Social History   Tobacco Use  Smoking Status Current Every Day Smoker  . Packs/day: 0.50  . Types: Cigarettes  Smokeless Tobacco Former NeurosurgeonUser  . Types: Chew   Social History   Substance and Sexual Activity  Alcohol Use Yes  . Alcohol/week: 18.0 standard drinks  . Types: 18 Cans of beer per week   Comment: daily    Family History:  Family History  Problem Relation Age of Onset  . Nephrolithiasis Father   . Cancer Father        Near Brain  . Stroke Father   . Factor V Leiden deficiency Mother   . Hypertension Maternal Aunt   . Diabetes Maternal Aunt   . Heart attack Maternal Aunt   . Hypertension Maternal Uncle   . Diabetes Maternal Uncle   . Heart disease Paternal Aunt   . Hypertension Maternal Grandmother   . Varicose Veins Maternal Grandmother   . Stroke Maternal Grandmother   . Cancer Paternal Grandmother        Liver  . Nephrolithiasis Paternal Grandmother   . Seizures Paternal Grandfather   . Prostate cancer Neg Hx   . Sickle cell trait Neg Hx   . Kidney cancer Neg Hx     Past medical history, surgical history, medications, allergies, family history and social history reviewed with patient today and changes made to appropriate areas of the chart.   Review of Systems  Constitutional: Negative.   HENT: Negative.   Eyes: Negative.   Respiratory: Negative.   Cardiovascular: Negative.     Gastrointestinal: Negative.   Genitourinary: Negative.   Musculoskeletal: Negative.   Skin: Negative.   Neurological: Negative.   Endo/Heme/Allergies: Negative.   Psychiatric/Behavioral: Negative.     All other ROS negative except what is listed above and in the HPI.      Objective:    BP 136/89   Pulse 90   Temp 98 F (36.7 C)   Ht 5' 11.65" (1.82 m)   Wt 181 lb 2 oz (82.2  kg)   SpO2 99%   BMI 24.80 kg/m   Wt Readings from Last 3 Encounters:  03/21/19 181 lb 2 oz (82.2 kg)  02/08/19 183 lb (83 kg)  04/30/17 182 lb 6.4 oz (82.7 kg)    Physical Exam Vitals and nursing note reviewed.  Constitutional:      General: He is not in acute distress.    Appearance: Normal appearance. He is obese. He is not ill-appearing, toxic-appearing or diaphoretic.  HENT:     Head: Normocephalic and atraumatic.     Right Ear: Tympanic membrane, ear canal and external ear normal. There is no impacted cerumen.     Left Ear: Tympanic membrane, ear canal and external ear normal. There is no impacted cerumen.     Nose: Nose normal. No congestion or rhinorrhea.     Mouth/Throat:     Mouth: Mucous membranes are moist.     Pharynx: Oropharynx is clear. No oropharyngeal exudate or posterior oropharyngeal erythema.  Eyes:     General: No scleral icterus.       Right eye: No discharge.        Left eye: No discharge.     Extraocular Movements: Extraocular movements intact.     Conjunctiva/sclera: Conjunctivae normal.     Pupils: Pupils are equal, round, and reactive to light.  Neck:     Vascular: No carotid bruit.  Cardiovascular:     Rate and Rhythm: Normal rate and regular rhythm.     Pulses: Normal pulses.     Heart sounds: No murmur. No friction rub. No gallop.   Pulmonary:     Effort: Pulmonary effort is normal. No respiratory distress.     Breath sounds: Normal breath sounds. No stridor. No wheezing, rhonchi or rales.  Chest:     Chest wall: No tenderness.  Abdominal:     General:  Abdomen is flat. Bowel sounds are normal. There is no distension.     Palpations: Abdomen is soft. There is no mass.     Tenderness: There is no abdominal tenderness. There is no right CVA tenderness, left CVA tenderness, guarding or rebound.     Hernia: No hernia is present.  Genitourinary:    Comments: Genital exam deferred with shared decision making Musculoskeletal:        General: No swelling, tenderness, deformity or signs of injury.     Cervical back: Normal range of motion and neck supple. No rigidity. No muscular tenderness.     Right lower leg: No edema.     Left lower leg: No edema.  Lymphadenopathy:     Cervical: No cervical adenopathy.  Skin:    General: Skin is warm and dry.     Capillary Refill: Capillary refill takes less than 2 seconds.     Coloration: Skin is not jaundiced or pale.     Findings: No bruising, erythema, lesion or rash.  Neurological:     General: No focal deficit present.     Mental Status: He is alert and oriented to person, place, and time.     Cranial Nerves: No cranial nerve deficit.     Sensory: No sensory deficit.     Motor: No weakness.     Coordination: Coordination normal.     Gait: Gait normal.     Deep Tendon Reflexes: Reflexes normal.  Psychiatric:        Mood and Affect: Mood normal.        Behavior: Behavior normal.  Thought Content: Thought content normal.        Judgment: Judgment normal.     Results for orders placed or performed in visit on 06/03/18  Microalbumin, Urine Waived  Result Value Ref Range   Microalb, Ur Waived 10 0 - 19 mg/L   Creatinine, Urine Waived 10 10 - 300 mg/dL   Microalb/Creat Ratio <30 <30 mg/g  Basic metabolic panel  Result Value Ref Range   Glucose 80 65 - 99 mg/dL   BUN 10 6 - 24 mg/dL   Creatinine, Ser 4.01 0.76 - 1.27 mg/dL   GFR calc non Af Amer 82 >59 mL/min/1.73   GFR calc Af Amer 94 >59 mL/min/1.73   BUN/Creatinine Ratio 10 9 - 20   Sodium 138 134 - 144 mmol/L   Potassium 4.2 3.5  - 5.2 mmol/L   Chloride 95 (L) 96 - 106 mmol/L   CO2 25 20 - 29 mmol/L   Calcium 9.6 8.7 - 10.2 mg/dL      Assessment & Plan:   Problem List Items Addressed This Visit      Cardiovascular and Mediastinum   Essential hypertension    Under good control off medicine. Continue to monitor. Rechecking labs today. Call with any concerns.       Relevant Orders   Microalbumin, Urine Waived     Genitourinary   BPH (benign prostatic hyperplasia)    Under good control off medicine. Continue to monitor. Rechecking labs today. Call with any concerns.       Relevant Orders   PSA    Other Visit Diagnoses    Routine general medical examination at a health care facility    -  Primary   Vaccines updated. Screening labs checked today. Colonoscopy scheduled for 04/10/19. Continue diet and exercise. Continue to monitor. Call with any concerns.    Relevant Orders   CBC with Differential/Platelet   Comprehensive metabolic panel   Lipid Panel w/o Chol/HDL Ratio   Microalbumin, Urine Waived   PSA   TSH   UA/M w/rflx Culture, Routine      LABORATORY TESTING:  Health maintenance labs ordered today as discussed above.   The natural history of prostate cancer and ongoing controversy regarding screening and potential treatment outcomes of prostate cancer has been discussed with the patient. The meaning of a false positive PSA and a false negative PSA has been discussed. He indicates understanding of the limitations of this screening test and wishes to proceed with screening PSA testing.   IMMUNIZATIONS:   - Tdap: Tetanus vaccination status reviewed: last tetanus booster within 10 years. - Influenza: Up to date - Pneumovax: Administered today  SCREENING: - Colonoscopy: scheduled for 2 weeks  Discussed with patient purpose of the colonoscopy is to detect colon cancer at curable precancerous or early stages   PATIENT COUNSELING:    Sexuality: Discussed sexually transmitted diseases, partner  selection, use of condoms, avoidance of unintended pregnancy  and contraceptive alternatives.   Advised to avoid cigarette smoking.  I discussed with the patient that most people either abstain from alcohol or drink within safe limits (<=14/week and <=4 drinks/occasion for males, <=7/weeks and <= 3 drinks/occasion for females) and that the risk for alcohol disorders and other health effects rises proportionally with the number of drinks per week and how often a drinker exceeds daily limits.  Discussed cessation/primary prevention of drug use and availability of treatment for abuse.   Diet: Encouraged to adjust caloric intake to maintain  or achieve ideal  body weight, to reduce intake of dietary saturated fat and total fat, to limit sodium intake by avoiding high sodium foods and not adding table salt, and to maintain adequate dietary potassium and calcium preferably from fresh fruits, vegetables, and low-fat dairy products.    stressed the importance of regular exercise  Injury prevention: Discussed safety belts, safety helmets, smoke detector, smoking near bedding or upholstery.   Dental health: Discussed importance of regular tooth brushing, flossing, and dental visits.   Follow up plan: NEXT PREVENTATIVE PHYSICAL DUE IN 1 YEAR. Return in about 1 year (around 03/20/2020) for physical.

## 2019-03-21 NOTE — Patient Instructions (Signed)

## 2019-03-22 ENCOUNTER — Encounter: Payer: Self-pay | Admitting: Family Medicine

## 2019-04-03 ENCOUNTER — Encounter: Payer: Self-pay | Admitting: Gastroenterology

## 2019-04-05 ENCOUNTER — Other Ambulatory Visit
Admission: RE | Admit: 2019-04-05 | Discharge: 2019-04-05 | Disposition: A | Discharge status: Home / Self Care | Payer: BC Managed Care – PPO | Source: Ambulatory Visit | Attending: Gastroenterology | Admitting: Gastroenterology

## 2019-04-05 DIAGNOSIS — Z20822 Contact with and (suspected) exposure to covid-19: Secondary | ICD-10-CM | POA: Diagnosis not present

## 2019-04-05 DIAGNOSIS — Z01812 Encounter for preprocedural laboratory examination: Secondary | ICD-10-CM | POA: Diagnosis not present

## 2019-04-06 LAB — SARS CORONAVIRUS 2 (TAT 6-24 HRS): SARS Coronavirus 2: NEGATIVE

## 2019-04-07 ENCOUNTER — Other Ambulatory Visit: Payer: BC Managed Care – PPO

## 2019-04-07 NOTE — Discharge Instructions (Signed)
General Anesthesia, Adult, Care After This sheet gives you information about how to care for yourself after your procedure. Your health care provider may also give you more specific instructions. If you have problems or questions, contact your health care provider. What can I expect after the procedure? After the procedure, the following side effects are common:  Pain or discomfort at the IV site.  Nausea.  Vomiting.  Sore throat.  Trouble concentrating.  Feeling cold or chills.  Weak or tired.  Sleepiness and fatigue.  Soreness and body aches. These side effects can affect parts of the body that were not involved in surgery. Follow these instructions at home:  For at least 24 hours after the procedure:  Have a responsible adult stay with you. It is important to have someone help care for you until you are awake and alert.  Rest as needed.  Do not: ? Participate in activities in which you could fall or become injured. ? Drive. ? Use heavy machinery. ? Drink alcohol. ? Take sleeping pills or medicines that cause drowsiness. ? Make important decisions or sign legal documents. ? Take care of children on your own. Eating and drinking  Follow any instructions from your health care provider about eating or drinking restrictions.  When you feel hungry, start by eating small amounts of foods that are soft and easy to digest (bland), such as toast. Gradually return to your regular diet.  Drink enough fluid to keep your urine pale yellow.  If you vomit, rehydrate by drinking water, juice, or clear broth. General instructions  If you have sleep apnea, surgery and certain medicines can increase your risk for breathing problems. Follow instructions from your health care provider about wearing your sleep device: ? Anytime you are sleeping, including during daytime naps. ? While taking prescription pain medicines, sleeping medicines, or medicines that make you drowsy.  Return to  your normal activities as told by your health care provider. Ask your health care provider what activities are safe for you.  Take over-the-counter and prescription medicines only as told by your health care provider.  If you smoke, do not smoke without supervision.  Keep all follow-up visits as told by your health care provider. This is important. Contact a health care provider if:  You have nausea or vomiting that does not get better with medicine.  You cannot eat or drink without vomiting.  You have pain that does not get better with medicine.  You are unable to pass urine.  You develop a skin rash.  You have a fever.  You have redness around your IV site that gets worse. Get help right away if:  You have difficulty breathing.  You have chest pain.  You have blood in your urine or stool, or you vomit blood. Summary  After the procedure, it is common to have a sore throat or nausea. It is also common to feel tired.  Have a responsible adult stay with you for the first 24 hours after general anesthesia. It is important to have someone help care for you until you are awake and alert.  When you feel hungry, start by eating small amounts of foods that are soft and easy to digest (bland), such as toast. Gradually return to your regular diet.  Drink enough fluid to keep your urine pale yellow.  Return to your normal activities as told by your health care provider. Ask your health care provider what activities are safe for you. This information is not   intended to replace advice given to you by your health care provider. Make sure you discuss any questions you have with your health care provider. Document Revised: 02/05/2017 Document Reviewed: 09/18/2016 Elsevier Patient Education  2020 [employer] Inc[employer]  

## 2019-04-07 NOTE — Anesthesia Preprocedure Evaluation (Addendum)
Anesthesia Evaluation  Patient identified by MRN, date of birth, ID band Patient awake    Reviewed: Allergy & Precautions, NPO status , Patient's Chart, lab work & pertinent test results  History of Anesthesia Complications Negative for: history of anesthetic complications  Airway Mallampati: II  TM Distance: >3 FB Neck ROM: Full    Dental  (+)    Pulmonary Current Smoker (1/2 ppd)Patient did not abstain from smoking.,    breath sounds clear to auscultation       Cardiovascular hypertension, (-) angina(-) DOE  Rhythm:Regular Rate:Normal     Neuro/Psych    GI/Hepatic neg GERD  ,(+)     substance abuse  alcohol use and marijuana use,   Endo/Other    Renal/GU Renal disease (Stones)     Musculoskeletal   Abdominal   Peds  Hematology   Anesthesia Other Findings   Reproductive/Obstetrics                            Anesthesia Physical Anesthesia Plan  ASA: II  Anesthesia Plan: General   Post-op Pain Management:    Induction: Intravenous  PONV Risk Score and Plan: 1 and Propofol infusion, TIVA and Treatment may vary due to age or medical condition  Airway Management Planned: Natural Airway and Nasal Cannula  Additional Equipment:   Intra-op Plan:   Post-operative Plan:   Informed Consent: I have reviewed the patients History and Physical, chart, labs and discussed the procedure including the risks, benefits and alternatives for the proposed anesthesia with the patient or authorized representative who has indicated his/her understanding and acceptance.       Plan Discussed with: CRNA and Anesthesiologist  Anesthesia Plan Comments:         Anesthesia Quick Evaluation

## 2019-04-10 ENCOUNTER — Ambulatory Visit: Payer: BC Managed Care – PPO | Admitting: Anesthesiology

## 2019-04-10 ENCOUNTER — Encounter
Admission: RE | Disposition: A | Discharge status: Home / Self Care | Payer: Self-pay | Source: Home / Self Care | Attending: Gastroenterology

## 2019-04-10 ENCOUNTER — Other Ambulatory Visit: Payer: Self-pay

## 2019-04-10 ENCOUNTER — Ambulatory Visit
Admission: RE | Admit: 2019-04-10 | Discharge: 2019-04-10 | Disposition: A | Discharge status: Home / Self Care | Payer: BC Managed Care – PPO | Attending: Gastroenterology | Admitting: Gastroenterology

## 2019-04-10 ENCOUNTER — Encounter: Payer: Self-pay | Admitting: Gastroenterology

## 2019-04-10 DIAGNOSIS — D128 Benign neoplasm of rectum: Secondary | ICD-10-CM | POA: Diagnosis not present

## 2019-04-10 DIAGNOSIS — K635 Polyp of colon: Secondary | ICD-10-CM

## 2019-04-10 DIAGNOSIS — Z823 Family history of stroke: Secondary | ICD-10-CM | POA: Insufficient documentation

## 2019-04-10 DIAGNOSIS — Z800 Family history of malignant neoplasm of digestive organs: Secondary | ICD-10-CM | POA: Insufficient documentation

## 2019-04-10 DIAGNOSIS — Z833 Family history of diabetes mellitus: Secondary | ICD-10-CM | POA: Insufficient documentation

## 2019-04-10 DIAGNOSIS — Z87442 Personal history of urinary calculi: Secondary | ICD-10-CM | POA: Insufficient documentation

## 2019-04-10 DIAGNOSIS — Z841 Family history of disorders of kidney and ureter: Secondary | ICD-10-CM | POA: Insufficient documentation

## 2019-04-10 DIAGNOSIS — M545 Low back pain: Secondary | ICD-10-CM | POA: Diagnosis not present

## 2019-04-10 DIAGNOSIS — Z8249 Family history of ischemic heart disease and other diseases of the circulatory system: Secondary | ICD-10-CM | POA: Insufficient documentation

## 2019-04-10 DIAGNOSIS — F17210 Nicotine dependence, cigarettes, uncomplicated: Secondary | ICD-10-CM | POA: Diagnosis not present

## 2019-04-10 DIAGNOSIS — Z832 Family history of diseases of the blood and blood-forming organs and certain disorders involving the immune mechanism: Secondary | ICD-10-CM | POA: Insufficient documentation

## 2019-04-10 DIAGNOSIS — Z1211 Encounter for screening for malignant neoplasm of colon: Secondary | ICD-10-CM | POA: Diagnosis not present

## 2019-04-10 DIAGNOSIS — K621 Rectal polyp: Secondary | ICD-10-CM | POA: Insufficient documentation

## 2019-04-10 DIAGNOSIS — Z791 Long term (current) use of non-steroidal anti-inflammatories (NSAID): Secondary | ICD-10-CM | POA: Diagnosis not present

## 2019-04-10 HISTORY — PX: POLYPECTOMY: SHX5525

## 2019-04-10 HISTORY — PX: COLONOSCOPY WITH PROPOFOL: SHX5780

## 2019-04-10 SURGERY — COLONOSCOPY WITH PROPOFOL
Anesthesia: General | Site: Rectum

## 2019-04-10 MED ORDER — STERILE WATER FOR IRRIGATION IR SOLN
Status: DC | PRN
  Administered 2019-04-10: 50 mL

## 2019-04-10 MED ORDER — ONDANSETRON HCL 4 MG/2ML IJ SOLN: 4.0000 mg | Freq: Once | INTRAMUSCULAR | Status: DC | PRN

## 2019-04-10 MED ORDER — LACTATED RINGERS IV SOLN
100.0000 mL/h | INTRAVENOUS | Status: DC
  Administered 2019-04-10: 100 mL/h via INTRAVENOUS

## 2019-04-10 MED ORDER — PROPOFOL 10 MG/ML IV BOLUS
INTRAVENOUS | Status: DC | PRN
  Administered 2019-04-10: 80 mg via INTRAVENOUS
  Administered 2019-04-10: 40 mg via INTRAVENOUS
  Administered 2019-04-10: 20 mg via INTRAVENOUS
  Administered 2019-04-10 (×3): 40 mg via INTRAVENOUS
  Administered 2019-04-10: 100 mg via INTRAVENOUS
  Administered 2019-04-10: 20 mg via INTRAVENOUS

## 2019-04-10 MED ORDER — LIDOCAINE HCL (CARDIAC) PF 100 MG/5ML IV SOSY
PREFILLED_SYRINGE | INTRAVENOUS | Status: DC | PRN
  Administered 2019-04-10: 20 mg via INTRAVENOUS

## 2019-04-10 MED ORDER — ACETAMINOPHEN 10 MG/ML IV SOLN: 1000.0000 mg | Freq: Once | INTRAVENOUS | Status: DC | PRN

## 2019-04-10 SURGICAL SUPPLY — 9 items
CANISTER SUCT 1200ML W/VALVE (MISCELLANEOUS) ×3 IMPLANT
ELECT REM PT RETURN 9FT ADLT (ELECTROSURGICAL) ×3
ELECTRODE REM PT RTRN 9FT ADLT (ELECTROSURGICAL) ×1 IMPLANT
GOWN CVR UNV OPN BCK APRN NK (MISCELLANEOUS) ×2 IMPLANT
GOWN ISOL THUMB LOOP REG UNIV (MISCELLANEOUS) ×4
KIT ENDO PROCEDURE OLY (KITS) ×3 IMPLANT
SNARE SHORT THROW 13M SML OVAL (MISCELLANEOUS) ×3 IMPLANT
TRAP ETRAP POLY (MISCELLANEOUS) ×3 IMPLANT
WATER STERILE IRR 250ML POUR (IV SOLUTION) ×3 IMPLANT

## 2019-04-10 NOTE — Transfer of Care (Signed)
Immediate Anesthesia Transfer of Care Note  Patient: Nathaniel Howard  Procedure(s) Performed: COLONOSCOPY WITH BIOPSY (N/A Rectum) POLYPECTOMY (N/A Rectum)  Patient Location: PACU  Anesthesia Type: General  Level of Consciousness: awake, alert  and patient cooperative  Airway and Oxygen Therapy: Patient Spontanous Breathing and Patient connected to supplemental oxygen  Post-op Assessment: Post-op Vital signs reviewed, Patient's Cardiovascular Status Stable, Respiratory Function Stable, Patent Airway and No signs of Nausea or vomiting  Post-op Vital Signs: Reviewed and stable  Complications: No apparent anesthesia complications

## 2019-04-10 NOTE — Anesthesia Procedure Notes (Signed)
Performed by: Cyerra Yim, CRNA       

## 2019-04-10 NOTE — H&P (Signed)
Wyline Mood, MD [address], [place], [place], [address] [address], [place], [place], [address] Phone: [phone]  Fax: [phone]  Primary Care Physician:  Dorcas Carrow, DO   Pre-Procedure History & Physical: HPI:  Nathaniel Howard is a 54 y.o. male is here for an colonoscopy.   Past Medical History:  Diagnosis Date  . Kidney stone   . Low back pain     Past Surgical History:  Procedure Laterality Date  . EXTRACORPOREAL SHOCK WAVE LITHOTRIPSY Right 12/06/2014   Procedure: EXTRACORPOREAL SHOCK WAVE LITHOTRIPSY (ESWL);  Surgeon: Lorraine Lax, MD;  Location: [facility] ORS;  Service: Urology;  Laterality: Right;  . LITHOTRIPSY Right     Prior to Admission medications   Medication Sig Start Date End Date Taking? Authorizing Provider  acetaminophen (TYLENOL) 325 MG tablet Take 325 mg by mouth every 6 (six) hours as needed. Reported on 02/15/2015   Yes [provider]  fluticasone (FLONASE) 50 MCG/ACT nasal spray Place 2 sprays into both nostrils daily.   Yes [provider]  ibuprofen (ADVIL,MOTRIN) 200 MG tablet Take 200 mg by mouth every 6 (six) hours as needed. Reported on 02/15/2015   Yes [provider]    Allergies as of 02/14/2019  . (No Known Allergies)    Family History  Problem Relation Age of Onset  . Nephrolithiasis Father   . Cancer Father        Near Brain  . Stroke Father   . Factor V Leiden deficiency Mother   . Hypertension Maternal Aunt   . Diabetes Maternal Aunt   . Heart attack Maternal Aunt   . Hypertension Maternal Uncle   . Diabetes Maternal Uncle   . Heart disease Paternal Aunt   . Hypertension Maternal Grandmother   . Varicose Veins Maternal Grandmother   . Stroke Maternal Grandmother   . Cancer Paternal Grandmother        Liver  . Nephrolithiasis Paternal Grandmother   . Seizures Paternal Grandfather   . Prostate cancer Neg Hx   . Sickle cell trait Neg Hx   . Kidney cancer Neg  Hx     Social History   Socioeconomic History  . Marital status: Married    Spouse name: Not on file  . Number of children: Not on file  . Years of education: Not on file  . Highest education level: Not on file  Occupational History  . Not on file  Tobacco Use  . Smoking status: Current Every Day Smoker    Packs/day: 0.50    Types: Cigarettes  . Smokeless tobacco: Former Neurosurgeon    Types: Chew  Substance and Sexual Activity  . Alcohol use: Yes    Alcohol/week: 18.0 standard drinks    Types: 18 Cans of beer per week    Comment: daily  . Drug use: Yes    Types: Marijuana  . Sexual activity: Yes    Partners: Male    Birth control/protection: None  Other Topics Concern  . Not on file  Social History Narrative  . Not on file   Social Determinants of Health   Financial Resource Strain:   . Difficulty of Paying Living Expenses: Not on file  Food Insecurity:   . Worried About Programme researcher, broadcasting/film/video in the Last Year: Not on file  . Ran Out of Food in the Last Year: Not on file  Transportation Needs:   . Lack of Transportation (Medical): Not  on file  . Lack of Transportation (Non-Medical): Not on file  Physical Activity:   . Days of Exercise per Week: Not on file  . Minutes of Exercise per Session: Not on file  Stress:   . Feeling of Stress : Not on file  Social Connections:   . Frequency of Communication with Friends and Family: Not on file  . Frequency of Social Gatherings with Friends and Family: Not on file  . Attends Religious Services: Not on file  . Active Member of Clubs or Organizations: Not on file  . Attends Archivist Meetings: Not on file  . Marital Status: Not on file  Intimate Partner Violence:   . Fear of Current or Ex-Partner: Not on file  . Emotionally Abused: Not on file  . Physically Abused: Not on file  . Sexually Abused: Not on file    Review of Systems: See HPI, otherwise negative ROS  Physical Exam: BP (!) 137/91   Pulse 75   Temp  (!) 97.5 F (36.4 C) (Temporal)   Resp 16   Ht 5' 11.65" (1.82 m)   Wt 84.8 kg   SpO2 100%   BMI 25.61 kg/m  General:   Alert,  pleasant and cooperative in NAD Head:  Normocephalic and atraumatic. Neck:  Supple; no masses or thyromegaly. Lungs:  Clear throughout to auscultation, normal respiratory effort.    Heart:  +S1, +S2, Regular rate and rhythm, No edema. Abdomen:  Soft, nontender and nondistended. Normal bowel sounds, without guarding, and without rebound.   Neurologic:  Alert and  oriented x4;  grossly normal neurologically.  Impression/Plan: Nathaniel Howard is here for an colonoscopy to be performed for Screening colonoscopy average risk   Risks, benefits, limitations, and alternatives regarding  colonoscopy have been reviewed with the patient.  Questions have been answered.  All parties agreeable.   Jonathon Bellows, MD  04/10/2019, 8:30 AM

## 2019-04-10 NOTE — Anesthesia Procedure Notes (Signed)
Procedure Name: MAC Date/Time: 04/10/2019 8:39 AM Performed by: Vanetta Shawl, CRNA Pre-anesthesia Checklist: Patient identified, Emergency Drugs available, Suction available, Timeout performed and Patient being monitored Patient Re-evaluated:Patient Re-evaluated prior to induction Oxygen Delivery Method: Nasal cannula Placement Confirmation: positive ETCO2

## 2019-04-10 NOTE — Op Note (Signed)
[facility] Patient Name: Keondrick Dilks Procedure Date: 04/10/2019 8:35 AM MRN: [id number] Account #: [id number] Date of Birth: Dec 01, 1965 Admit Type: Outpatient Age: 54 Room: [facility] OR ROOM 01 Gender: Male Note Status: Finalized Procedure:             Colonoscopy Indications:           Screening for colorectal malignant neoplasm Providers:             Jonathon Bellows MD, MD Referring MD:          Valerie Roys (Referring MD) Medicines:             Monitored Anesthesia Care Complications:         No immediate complications. Procedure:             Pre-Anesthesia Assessment:                        - Prior to the procedure, a History and Physical was                         performed, and patient medications, allergies and                         sensitivities were reviewed. The patient's tolerance                         of previous anesthesia was reviewed.                        - The risks and benefits of the procedure and the                         sedation options and risks were discussed with the                         patient. All questions were answered and informed                         consent was obtained.                        - ASA Grade Assessment: II - A patient with mild                         systemic disease.                        After obtaining informed consent, the colonoscope was                         passed under direct vision. Throughout the procedure,                         the patient's blood pressure, pulse, and oxygen                         saturations were monitored continuously. The was                         introduced through the anus and advanced  to the the                         cecum, identified by the appendiceal orifice. The                         colonoscopy was performed with ease. The patient                         tolerated the procedure well. The quality of the bowel                          preparation was adequate to identify polyps. Findings:      The perianal and digital rectal examinations were normal.      Three sessile polyps were found in the rectum. The polyps were 8 to 10       mm in size. These polyps were removed with a hot snare. Resection and       retrieval were complete.      Three sessile polyps were found in the rectum. The polyps were 5 to 6 mm       in size. These polyps were removed with a cold snare. Resection and       retrieval were complete.      The exam was otherwise without abnormality on direct and retroflexion       views. Impression:            - Three 8 to 10 mm polyps in the rectum, removed with                         a hot snare. Resected and retrieved.                        - Three 5 to 6 mm polyps in the rectum, removed with a                         cold snare. Resected and retrieved.                        - The examination was otherwise normal on direct and                         retroflexion views. Recommendation:        - Discharge patient to home (with escort).                        - Resume previous diet.                        - Continue present medications.                        - Await pathology results.                        - Repeat colonoscopy in 3 years for surveillance. Procedure Code(s):     --- Professional ---                        [phone], Colonoscopy, flexible; with removal  of                         tumor(s), polyp(s), or other lesion(s) by snare                         technique Diagnosis Code(s):     --- Professional ---                        Z12.11, Encounter for screening for malignant neoplasm                         of colon                        K62.1, Rectal polyp CPT copyright 2019 American Medical Association. All rights reserved. The codes documented in this report are preliminary and upon coder review may  be revised to meet current compliance requirements. Wyline Mood, MD Wyline Mood MD,  MD 04/10/2019 8:56:49 AM This report has been signed electronically. Number of Addenda: 0 Note Initiated On: 04/10/2019 8:35 AM Scope Withdrawal Time: 0 hours 11 minutes 39 seconds  Total Procedure Duration: 0 hours 13 minutes 35 seconds  Estimated Blood Loss:  Estimated blood loss: none.      [facility]

## 2019-04-10 NOTE — Anesthesia Postprocedure Evaluation (Signed)
Anesthesia Post Note  Patient: Nathaniel Howard  Procedure(s) Performed: COLONOSCOPY WITH BIOPSY (N/A Rectum) POLYPECTOMY (N/A Rectum)     Patient location during evaluation: PACU Anesthesia Type: General Level of consciousness: awake and alert Pain management: pain level controlled Vital Signs Assessment: post-procedure vital signs reviewed and stable Respiratory status: spontaneous breathing, nonlabored ventilation, respiratory function stable and patient connected to nasal cannula oxygen Cardiovascular status: blood pressure returned to baseline and stable Postop Assessment: no apparent nausea or vomiting Anesthetic complications: no    Achille Xiang A  Adeana Grilliot

## 2019-04-11 ENCOUNTER — Encounter: Payer: Self-pay | Admitting: *Deleted

## 2019-04-12 LAB — SURGICAL PATHOLOGY

## 2019-04-24 ENCOUNTER — Other Ambulatory Visit: Payer: Self-pay

## 2019-04-24 ENCOUNTER — Encounter: Payer: Self-pay | Admitting: Family Medicine

## 2019-04-24 ENCOUNTER — Ambulatory Visit (INDEPENDENT_AMBULATORY_CARE_PROVIDER_SITE_OTHER): Payer: BC Managed Care – PPO | Admitting: Family Medicine

## 2019-04-24 VITALS — BP 131/83 | HR 102 | Temp 98.1°F | Wt 189.2 lb

## 2019-04-24 DIAGNOSIS — H1032 Unspecified acute conjunctivitis, left eye: Secondary | ICD-10-CM

## 2019-04-24 MED ORDER — ERYTHROMYCIN 5 MG/GM OP OINT: 1.0000 "application " | TOPICAL_OINTMENT | Freq: Every day | OPHTHALMIC | 0 refills | Status: DC

## 2019-04-24 NOTE — Progress Notes (Signed)
BP 131/83   Pulse (!) 102   Temp 98.1 F (36.7 C)   Wt 189 lb 4 oz (85.8 kg)   SpO2 97%   BMI 25.92 kg/m    Subjective:    Patient ID: Nathaniel Howard, male    DOB: 1965-04-15, 54 y.o.   MRN: [id number]  HPI: Nathaniel Howard is a 54 y.o. male  Chief Complaint  Patient presents with  . Eye Problem    discharging, red, crusty since Saturday   EYE REDNESS Duration:  3 days Involved eye:  left Onset: sudden Quality: crusting Foreign body sensation:no Visual impairment: no Eye redness: yes Discharge: yes Crusting or matting of eyelids: yes Swelling: yes Photophobia: no Itching: yes Tearing: yes Headache: no Floaters: no URI symptoms: no Contact lens use: no Close contacts with similar problems: no Eye trauma: no Aggravating factors:  Alleviating factors:  Status: stable Treatments attempted: compresses  Relevant past medical, surgical, family and social history reviewed and updated as indicated. Interim medical history since our last visit reviewed. Allergies and medications reviewed and updated.  Review of Systems  Constitutional: Negative.   HENT: Negative.   Eyes: Positive for redness. Negative for photophobia, pain, discharge, itching and visual disturbance.  Respiratory: Negative.   Cardiovascular: Negative.   Psychiatric/Behavioral: Negative.     Per HPI unless specifically indicated above     Objective:    BP 131/83   Pulse (!) 102   Temp 98.1 F (36.7 C)   Wt 189 lb 4 oz (85.8 kg)   SpO2 97%   BMI 25.92 kg/m   Wt Readings from Last 3 Encounters:  04/24/19 189 lb 4 oz (85.8 kg)  04/10/19 187 lb (84.8 kg)  03/21/19 181 lb 2 oz (82.2 kg)    Physical Exam Vitals and nursing note reviewed.  Constitutional:      General: He is not in acute distress.    Appearance: Normal appearance. He is not ill-appearing, toxic-appearing or diaphoretic.  HENT:     Head: Normocephalic and atraumatic.     Right Ear: External ear normal.     Left Ear:  External ear normal.     Nose: Nose normal.     Mouth/Throat:     Mouth: Mucous membranes are moist.     Pharynx: Oropharynx is clear.  Eyes:     General: No scleral icterus.       Right eye: No discharge.        Left eye: No discharge.     Extraocular Movements: Extraocular movements intact.     Conjunctiva/sclera: Conjunctivae normal.     Pupils: Pupils are equal, round, and reactive to light.     Comments: L eye erythematous conjunctiva   Cardiovascular:     Rate and Rhythm: Normal rate and regular rhythm.     Pulses: Normal pulses.     Heart sounds: Normal heart sounds. No murmur. No friction rub. No gallop.   Pulmonary:     Effort: Pulmonary effort is normal. No respiratory distress.     Breath sounds: Normal breath sounds. No stridor. No wheezing, rhonchi or rales.  Chest:     Chest wall: No tenderness.  Musculoskeletal:        General: Normal range of motion.     Cervical back: Normal range of motion and neck supple.  Skin:    General: Skin is warm and dry.     Capillary Refill: Capillary refill takes less than 2 seconds.  Coloration: Skin is not jaundiced or pale.     Findings: No bruising, erythema, lesion or rash.  Neurological:     General: No focal deficit present.     Mental Status: He is alert and oriented to person, place, and time. Mental status is at baseline.  Psychiatric:        Mood and Affect: Mood normal.        Behavior: Behavior normal.        Thought Content: Thought content normal.        Judgment: Judgment normal.     Results for orders placed or performed during the hospital encounter of 04/10/19  Surgical pathology  Result Value Ref Range   SURGICAL PATHOLOGY      SURGICAL PATHOLOGY CASE: [id number] PATIENT: [facility] Surgical Pathology Report     Specimen Submitted: A. Rectum polyp x6; hot/cold snare  Clinical History: Screening colonoscopy.  Colon polyps    DIAGNOSIS: A. RECTAL POLYPS X6; HOT AND COLD SNARE: -  FRAGMENTS (X3) OF TUBULOVILLOUS ADENOMA. - FRAGMENTS (X7) OF HYPERPLASTIC POLYPS. - NEGATIVE FOR HIGH-GRADE DYSPLASIA AND MALIGNANCY. - CAUTERIZED BASE OF ADENOMATOUS POLYP APPEARS FREE OF DYSPLASIA.  GROSS DESCRIPTION: A. Labeled: Rectal polyp x6 hot/cold snare Received: In formalin Tissue fragment(s): 6 Size: From 0.4 x 0.3 x 0.2 cm up to 1 x 0.6 x 0.4 cm Description: Pink-red polypoid fragments, base of larger pieces inked, sectioned Entirely submitted in 1 cassette.   Final Diagnosis performed by Allena Napoleon, MD.   Electronically signed 04/12/2019 8:49:51AM The electronic signature indicates that the named Attending Pathologist has evaluated the specimen Technical component performed at [facility], [address] [place], [place] [address] Lab: [phone] Dir: Rush Farmer, MD, MMM  Professional component performed at [facility], [facility], [place], [place], [place] [address] Lab: [phone] Dir: Dellia Nims. Reuel Derby, MD       Assessment & Plan:   Problem List Items Addressed This Visit    None    Visit Diagnoses    Acute bacterial conjunctivitis of left eye    -  Primary   Will treat with erythromycin. Call if not getting better or getting worse. Conitnue to monitor.        Follow up plan: Return if symptoms worsen or fail to improve.

## 2019-05-02 ENCOUNTER — Encounter: Payer: Self-pay | Admitting: Gastroenterology

## 2019-11-15 ENCOUNTER — Encounter: Payer: Self-pay | Admitting: Emergency Medicine

## 2019-11-15 ENCOUNTER — Ambulatory Visit
Admission: EM | Admit: 2019-11-15 | Discharge: 2019-11-15 | Disposition: A | Discharge status: Home / Self Care | Payer: BC Managed Care – PPO | Attending: Emergency Medicine | Admitting: Emergency Medicine

## 2019-11-15 ENCOUNTER — Other Ambulatory Visit: Payer: Self-pay

## 2019-11-15 DIAGNOSIS — Z20822 Contact with and (suspected) exposure to covid-19: Secondary | ICD-10-CM | POA: Insufficient documentation

## 2019-11-15 DIAGNOSIS — B9789 Other viral agents as the cause of diseases classified elsewhere: Secondary | ICD-10-CM | POA: Insufficient documentation

## 2019-11-15 DIAGNOSIS — J988 Other specified respiratory disorders: Secondary | ICD-10-CM | POA: Insufficient documentation

## 2019-11-15 LAB — GROUP A STREP BY PCR: Group A Strep by PCR: NOT DETECTED

## 2019-11-15 LAB — RAPID INFLUENZA A&B ANTIGENS
Influenza A (ARMC): NEGATIVE
Influenza B (ARMC): NEGATIVE

## 2019-11-15 MED ORDER — BENZONATATE 200 MG PO CAPS: 200.0000 mg | ORAL_CAPSULE | Freq: Three times a day (TID) | ORAL | 0 refills | Status: DC | PRN

## 2019-11-15 MED ORDER — IBUPROFEN 600 MG PO TABS: 600.0000 mg | ORAL_TABLET | Freq: Four times a day (QID) | ORAL | 0 refills | Status: DC | PRN

## 2019-11-15 NOTE — ED Triage Notes (Signed)
Patient c/o cough, nasal congestion, headache that started Friday. Patient tested Monday negative for COVID.

## 2019-11-15 NOTE — Discharge Instructions (Addendum)
Continue Flonase, Mucinex, saline nasal irrigation with a Lloyd Huger med rinse and distilled water as often as you want.  1 gram of Tylenol and 600 mg ibuprofen together 3-4 times a day as needed for pain.  Make sure you drink plenty of extra fluids.  Some people find salt water gargles and  Traditional Medicinal's "Throat Coat" tea helpful. Take 5 mL of liquid Benadryl and 5 mL of Maalox. Mix it together, and then hold it in your mouth for as long as you can and then swallow. You may do this 4 times a day.    Go to www.goodrx.com to look up your medications. This will give you a list of where you can find your prescriptions at the most affordable prices. Or ask the pharmacist what the cash price is, or if they have any other discount programs available to help make your medication more affordable. This can be less expensive than what you would pay with insurance.

## 2019-11-15 NOTE — ED Provider Notes (Signed)
HPI  SUBJECTIVE:  Nathaniel Howard is a 54 y.o. male who presents with 5 days of sore throat, nasal congestion, postnasal drip, yellowish rhinorrhea.  He reports body aches, headaches, sinus pain and pressure.  He reports a cough productive of the same material as his nasal congestion and some diarrhea.  No fevers, loss of sense of smell or taste, facial swelling, upper dental pain, shortness of breath, nausea, vomiting, abdominal pain.  States that he is able to sleep okay without waking up coughing at night.  No known exposure to Covid.  He got both doses of Covid vaccine last dose in August.  He states that he had a negative Covid PCR test on Monday.  No allergy symptoms.  No antibiotics in the past month.  No antipyretic in the past 6 hours.  His wife has identical symptoms currently.  Has tried Zicam, Flonase, Tylenol, ibuprofen.  Flonase helps.  No aggravating factors.  He has a past medical history of hypertension, allergies to grass, is a smoker.  No history of diabetes, frequent sinusitis, pulmonary disease, chronic kidney disease, HIV, cancer, immunocompromise.  PMD: Valerie Roys, DO     Past Medical History:  Diagnosis Date  . Kidney stone   . Low back pain     Past Surgical History:  Procedure Laterality Date  . COLONOSCOPY WITH PROPOFOL N/A 04/10/2019   Procedure: COLONOSCOPY WITH BIOPSY;  Surgeon: Jonathon Bellows, MD;  Location: [facility];  Service: Endoscopy;  Laterality: N/A;  . EXTRACORPOREAL SHOCK WAVE LITHOTRIPSY Right 12/06/2014   Procedure: EXTRACORPOREAL SHOCK WAVE LITHOTRIPSY (ESWL);  Surgeon: Collier Flowers, MD;  Location: [facility];  Service: Urology;  Laterality: Right;  . LITHOTRIPSY Right   . POLYPECTOMY N/A 04/10/2019   Procedure: POLYPECTOMY;  Surgeon: Jonathon Bellows, MD;  Location: [facility];  Service: Endoscopy;  Laterality: N/A;    Family History  Problem Relation Age of Onset  . Nephrolithiasis Father   . Cancer Father        Near Brain  .  Stroke Father   . Factor V Leiden deficiency Mother   . Hypertension Maternal Aunt   . Diabetes Maternal Aunt   . Heart attack Maternal Aunt   . Hypertension Maternal Uncle   . Diabetes Maternal Uncle   . Heart disease Paternal Aunt   . Hypertension Maternal Grandmother   . Varicose Veins Maternal Grandmother   . Stroke Maternal Grandmother   . Cancer Paternal Grandmother        Liver  . Nephrolithiasis Paternal Grandmother   . Seizures Paternal Grandfather   . Prostate cancer Neg Hx   . Sickle cell trait Neg Hx   . Kidney cancer Neg Hx     Social History   Tobacco Use  . Smoking status: Current Every Day Smoker    Packs/day: 0.50    Types: Cigarettes  . Smokeless tobacco: Former Systems developer    Types: Secondary school teacher  . Vaping Use: Never used  Substance Use Topics  . Alcohol use: Yes    Alcohol/week: 18.0 standard drinks    Types: 18 Cans of beer per week    Comment: daily  . Drug use: Yes    Types: Marijuana    No current facility-administered medications for this encounter.  Current Outpatient Medications:  .  fluticasone (FLONASE) 50 MCG/ACT nasal spray, Place 2 sprays into both nostrils daily., Disp: , Rfl:  .  benzonatate (TESSALON) 200 MG capsule, Take 1 capsule (200 mg  total) by mouth 3 (three) times daily as needed for cough., Disp: 30 capsule, Rfl: 0 .  ibuprofen (ADVIL) 600 MG tablet, Take 1 tablet (600 mg total) by mouth every 6 (six) hours as needed., Disp: 30 tablet, Rfl: 0  No Known Allergies   ROS  As noted in HPI.   Physical Exam  BP (!) 140/97 (BP Location: Right Arm)   Pulse 88   Temp 98.4 F (36.9 C) (Oral)   Resp 18   Ht 6' (1.829 m)   Wt 82.6 kg   SpO2 99%   BMI 24.68 kg/m   Constitutional: Well developed, well nourished, no acute distress Eyes:  EOMI, conjunctiva normal bilaterally HENT: Normocephalic, atraumatic,mucus membranes moist clear nasal congestion.  Erythematous, swollen turbinates.  No maxillary, frontal sinus tenderness.   Normal oropharynx.  Positive postnasal drip. Neck: No cervical lymphadenopathy Respiratory: Normal inspiratory effort lungs clear bilaterally Cardiovascular: Normal rate regular rhythm no murmurs rubs or gallops GI: nondistended skin: No rash, skin intact Musculoskeletal: no deformities Neurologic: Alert & oriented x 3, no focal neuro deficits Psychiatric: Speech and behavior appropriate   ED Course   Medications - No data to display  Orders Placed This Encounter  Procedures  . Group A Strep by PCR    Standing Status:   Standing    Number of Occurrences:   1    Order Specific Question:   Patient immune status    Answer:   Normal  . SARS CORONAVIRUS 2 (TAT 6-24 HRS) Nasopharyngeal Nasopharyngeal Swab    Standing Status:   Standing    Number of Occurrences:   1    Order Specific Question:   Is this test for diagnosis or screening    Answer:   Diagnosis of ill patient    Order Specific Question:   Symptomatic for COVID-19 as defined by CDC    Answer:   Yes    Order Specific Question:   Date of Symptom Onset    Answer:   11/10/2019    Order Specific Question:   Hospitalized for COVID-19    Answer:   No    Order Specific Question:   Admitted to ICU for COVID-19    Answer:   No    Order Specific Question:   Previously tested for COVID-19    Answer:   Yes    Order Specific Question:   Resident in a congregate (group) care setting    Answer:   No    Order Specific Question:   Employed in healthcare setting    Answer:   No    Order Specific Question:   Has patient completed COVID vaccination(s) (2 doses of Pfizer/Moderna 1 dose of [employer])    Answer:   Yes  . Rapid Influenza A&B Antigens    Standing Status:   Standing    Number of Occurrences:   1    Results for orders placed or performed during the hospital encounter of 11/15/19 (from the past 24 hour(s))  Group A Strep by PCR     Status: None   Collection Time: 11/15/19  4:30 PM   Specimen: Throat; Sterile Swab   Result Value Ref Range   Group A Strep by PCR NOT DETECTED NOT DETECTED  SARS CORONAVIRUS 2 (TAT 6-24 HRS) Nasopharyngeal Nasopharyngeal Swab     Status: None   Collection Time: 11/15/19  4:30 PM   Specimen: Nasopharyngeal Swab  Result Value Ref Range   SARS Coronavirus 2 NEGATIVE NEGATIVE  Rapid Influenza A&B Antigens     Status: None   Collection Time: 11/15/19  4:30 PM   Specimen: Flu Kit Nasopharyngeal Swab; Respiratory  Result Value Ref Range   Influenza A ([facility]) NEGATIVE NEGATIVE   Influenza B ([facility]) NEGATIVE NEGATIVE   No results found.  ED Clinical Impression  1. Viral respiratory illness   2. Encounter for laboratory testing for COVID-19 virus      ED Assessment/Plan  Strep, flu negative.  Covid PCR sent.  He will be a candidate for monoclonal antibody infusion based on history of hypertension.  COVID negative. Pt with URI.   Home with continued Flonase, Mucinex, Tylenol/ibuprofen, Benadryl/Maalox, Tessalon.  Follow-up with PMD or here in 5 days if not better we can consider antibiotics at that time.  Work note for 2 days  Discussed labs,  MDM, treatment plan, and plan for follow-up with patient.  patient agrees with plan.   Meds ordered this encounter  Medications  . ibuprofen (ADVIL) 600 MG tablet    Sig: Take 1 tablet (600 mg total) by mouth every 6 (six) hours as needed.    Dispense:  30 tablet    Refill:  0  . benzonatate (TESSALON) 200 MG capsule    Sig: Take 1 capsule (200 mg total) by mouth 3 (three) times daily as needed for cough.    Dispense:  30 capsule    Refill:  0    *This clinic note was created using [employer]. Therefore, there may be occasional mistakes despite careful proofreading.   ?    Melynda Ripple, MD 11/16/19 [phone]

## 2019-11-16 LAB — SARS CORONAVIRUS 2 (TAT 6-24 HRS): SARS Coronavirus 2: NEGATIVE

## 2020-01-14 ENCOUNTER — Encounter: Payer: Self-pay | Admitting: Emergency Medicine

## 2020-01-14 ENCOUNTER — Ambulatory Visit
Admission: EM | Admit: 2020-01-14 | Discharge: 2020-01-14 | Disposition: A | Discharge status: Home / Self Care | Payer: BC Managed Care – PPO | Attending: Physician Assistant | Admitting: Physician Assistant

## 2020-01-14 ENCOUNTER — Other Ambulatory Visit: Payer: Self-pay

## 2020-01-14 DIAGNOSIS — R059 Cough, unspecified: Secondary | ICD-10-CM | POA: Diagnosis not present

## 2020-01-14 DIAGNOSIS — B349 Viral infection, unspecified: Secondary | ICD-10-CM | POA: Insufficient documentation

## 2020-01-14 DIAGNOSIS — Z20822 Contact with and (suspected) exposure to covid-19: Secondary | ICD-10-CM | POA: Insufficient documentation

## 2020-01-14 LAB — RESP PANEL BY RT-PCR (FLU A&B, COVID) ARPGX2
Influenza A by PCR: NEGATIVE
Influenza B by PCR: NEGATIVE
SARS Coronavirus 2 by RT PCR: NEGATIVE

## 2020-01-14 NOTE — ED Triage Notes (Signed)
Patient in today c/o nasal congestion, fatigue, cough, body aches and headache x 2 days. Patient son tested positive for covid today at [facility]. Patient has taken OTC Mucinex and Tylenol.

## 2020-01-14 NOTE — ED Provider Notes (Signed)
[facility]    CSN: [id number] Arrival date & time: 01/14/20  1532      History   Chief Complaint Chief Complaint  Patient presents with  . Nasal Congestion  . Fatigue  . Cough  . Generalized Body Aches  . Headache    HPI Nathaniel Howard is a 54 y.o. male presenting for 2-day history of nasal congestion, fatigue, cough, body aches and headaches.  Patient states that his son did test positive for Covid today.  Patient states that he is fully vaccinated for COVID-19.  He has been taking over-the-counter Mucinex and Tylenol with improvement in his symptoms.  He states that he feels better today than he did yesterday.  He denies any fevers, chest pain or breathing difficulty.  Past medical history is significant for hypertension and he is a current smoker.  He does not report any other complaints or concerns at this time.  HPI  Past Medical History:  Diagnosis Date  . Kidney stone   . Low back pain     Patient Active Problem List   Diagnosis Date Noted  . Essential hypertension 05/16/2018  . BPH (benign prostatic hyperplasia) 11/20/2016  . Kidney stone     Past Surgical History:  Procedure Laterality Date  . COLONOSCOPY WITH PROPOFOL N/A 04/10/2019   Procedure: COLONOSCOPY WITH BIOPSY;  Surgeon: Wyline MoodAnna, Kiran, MD;  [facility];  Service: Endoscopy;  Laterality: N/A;  . EXTRACORPOREAL SHOCK WAVE LITHOTRIPSY Right 12/06/2014   Procedure: EXTRACORPOREAL SHOCK WAVE LITHOTRIPSY (ESWL);  Surgeon: Lorraine Laxichard D Hart, MD;  Location: [facility];  Service: Urology;  Laterality: Right;  . LITHOTRIPSY Right   . POLYPECTOMY N/A 04/10/2019   Procedure: POLYPECTOMY;  Surgeon: Wyline MoodAnna, Kiran, MD;  [facility];  Service: Endoscopy;  Laterality: N/A;       Home Medications    Prior to Admission medications   Medication Sig Start Date End Date Taking? Authorizing Provider  fluticasone (FLONASE) 50 MCG/ACT nasal spray Place 2 sprays into both  nostrils daily.   Yes [provider]  benzonatate (TESSALON) 200 MG capsule Take 1 capsule (200 mg total) by mouth 3 (three) times daily as needed for cough. 11/15/19   Domenick GongMortenson, Ashley, MD  ibuprofen (ADVIL) 600 MG tablet Take 1 tablet (600 mg total) by mouth every 6 (six) hours as needed. 11/15/19   Domenick GongMortenson, Ashley, MD    Family History Family History  Problem Relation Age of Onset  . Nephrolithiasis Father   . Cancer Father        Near Brain  . Stroke Father   . Factor V Leiden deficiency Mother   . Hypertension Maternal Aunt   . Diabetes Maternal Aunt   . Heart attack Maternal Aunt   . Hypertension Maternal Uncle   . Diabetes Maternal Uncle   . Heart disease Paternal Aunt   . Hypertension Maternal Grandmother   . Varicose Veins Maternal Grandmother   . Stroke Maternal Grandmother   . Cancer Paternal Grandmother        Liver  . Nephrolithiasis Paternal Grandmother   . Seizures Paternal Grandfather   . Prostate cancer Neg Hx   . Sickle cell trait Neg Hx   . Kidney cancer Neg Hx     Social History Social History   Tobacco Use  . Smoking status: Current Every Day Smoker    Packs/day: 0.50    Types: Cigarettes  . Smokeless tobacco: Former NeurosurgeonUser    Types: Designer, multimediaChew  Vaping  Use  . Vaping Use: Never used  Substance Use Topics  . Alcohol use: Yes    Alcohol/week: 18.0 standard drinks    Types: 18 Cans of beer per week    Comment: daily  . Drug use: Yes    Frequency: 8.0 times per week    Types: Marijuana    Comment: 1-2 times a day 3-4 days per week     Allergies   Patient has no known allergies.   Review of Systems Review of Systems  Constitutional: Positive for fatigue. Negative for fever.  HENT: Positive for congestion. Negative for rhinorrhea, sinus pressure, sinus pain and sore throat.   Respiratory: Positive for cough. Negative for shortness of breath.   Gastrointestinal: Negative for abdominal pain, diarrhea, nausea and vomiting.    Musculoskeletal: Positive for myalgias.  Neurological: Positive for headaches. Negative for weakness and light-headedness.  Hematological: Negative for adenopathy.     Physical Exam Triage Vital Signs ED Triage Vitals  Enc Vitals Group     BP 01/14/20 1542 (!) 145/95     Pulse Rate 01/14/20 1542 94     Resp 01/14/20 1542 18     Temp 01/14/20 1542 97.7 F (36.5 C)     Temp Source 01/14/20 1542 Oral     SpO2 01/14/20 1542 99 %     Weight 01/14/20 1543 182 lb (82.6 kg)     Height 01/14/20 1543 6' (1.829 m)     Head Circumference --      Peak Flow --      Pain Score 01/14/20 1541 0     Pain Loc --      Pain Edu? --      Excl. in GC? --    No data found.  Updated Vital Signs BP (!) 145/95 (BP Location: Left Arm)   Pulse 94   Temp 97.7 F (36.5 C) (Oral)   Resp 18   Ht 6' (1.829 m)   Wt 182 lb (82.6 kg)   SpO2 99%   BMI 24.68 kg/m       Physical Exam Vitals and nursing note reviewed.  Constitutional:      General: He is not in acute distress.    Appearance: Normal appearance. He is well-developed. He is not ill-appearing, toxic-appearing or diaphoretic.  HENT:     Head: Normocephalic and atraumatic.     Nose: Congestion and rhinorrhea present.     Mouth/Throat:     Pharynx: Uvula midline. No oropharyngeal exudate.     Tonsils: No tonsillar abscesses.  Eyes:     General: No scleral icterus.       Right eye: No discharge.        Left eye: No discharge.     Conjunctiva/sclera: Conjunctivae normal.  Neck:     Thyroid: No thyromegaly.     Trachea: No tracheal deviation.  Cardiovascular:     Rate and Rhythm: Normal rate and regular rhythm.     Heart sounds: Normal heart sounds.  Pulmonary:     Effort: Pulmonary effort is normal. No respiratory distress.     Breath sounds: Normal breath sounds. No stridor. No wheezing or rales.  Chest:     Chest wall: No tenderness.  Musculoskeletal:     Cervical back: Normal range of motion and neck supple.   Lymphadenopathy:     Cervical: No cervical adenopathy.  Skin:    General: Skin is warm and dry.     Findings: No rash.  Neurological:  General: No focal deficit present.     Mental Status: He is alert. Mental status is at baseline.     Motor: No weakness.     Gait: Gait normal.  Psychiatric:        Mood and Affect: Mood normal.        Behavior: Behavior normal.        Thought Content: Thought content normal.      UC Treatments / Results  Labs (all labs ordered are listed, but only abnormal results are displayed) Labs Reviewed  RESP PANEL BY RT-PCR (FLU A&B, COVID) [id number]    EKG   Radiology No results found.  Procedures Procedures (including critical care time)  Medications Ordered in UC Medications - No data to display  Initial Impression / Assessment and Plan / UC Course  I have reviewed the triage vital signs and the nursing notes.  Pertinent labs & imaging results that were available during my care of the patient were reviewed by me and considered in my medical decision making (see chart for details).   54 year old male presenting for 2-day history of cough, headaches, body aches and congestion.  He has been exposed to COVID-19 and that is his concern today.  He is fully vaccinated.  All vital signs are stable.  He is afebrile.  He is well-appearing on exam and his chest is clear to auscultation.  He is in no acute distress.  Respiratory panel obtained today which was negative.  Advised patient supportive care with continuing over-the-counter Mucinex and Tylenol as needed.  Advised him to increase rest and fluid intake.  Advised him to make sure his son is isolated from him for the 10-day.  Advised patient to stay home 10 days from his symptom onset since he has been exposed and does have some Covid type symptoms in the case that his Covid test was a false negative.  Advised him to follow-up with our department or emergency room if he develops a fever, worsening  cough or breathing difficulty.  Final Clinical Impressions(s) / UC Diagnoses   Final diagnoses:  Viral illness  Exposure to COVID-19 virus  Cough     Discharge Instructions     You have received COVID testing today either for positive exposure, concerning symptoms that could be related to COVID infection, screening purposes, or re-testing after confirmed positive.  Your test obtained today checks for active viral infection in the last 1-2 weeks. If your test is negative now, you can still test positive later. So, if you do develop symptoms you should either get re-tested and/or isolate x 10 days. Please follow CDC guidelines.  While Rapid antigen tests come back in 15-20 minutes, send out PCR/molecular test results typically come back within 24 hours. In the mean time, if you are symptomatic, assume this could be a positive test and treat/monitor yourself as if you do have COVID.   We will call with test results. Please download the MyChart app and set up a profile to access test results.   If symptomatic, go home and rest. Push fluids. Take Tylenol as needed for discomfort. Gargle warm salt water. Throat lozenges. Take Mucinex DM or Robitussin for cough. Humidifier in bedroom to ease coughing. Warm showers. Also review the COVID handout for more information.  COVID-19 INFECTION: The incubation period of COVID-19 is approximately 14 days after exposure, with most symptoms developing in roughly 4-5 days. Symptoms may range in severity from mild to critically severe. Roughly 80% of those infected  will have mild symptoms. People of any age may become infected with COVID-19 and have the ability to transmit the virus. The most common symptoms include: fever, fatigue, cough, body aches, headaches, sore throat, nasal congestion, shortness of breath, nausea, vomiting, diarrhea, changes in smell and/or taste.    COURSE OF ILLNESS Some patients may begin with mild disease which can progress quickly  into critical symptoms. If your symptoms are worsening please call ahead to the Emergency Department and proceed there for further treatment. Recovery time appears to be roughly 1-2 weeks for mild symptoms and 3-6 weeks for severe disease.   GO IMMEDIATELY TO ER FOR FEVER YOU ARE UNABLE TO GET DOWN WITH TYLENOL, BREATHING PROBLEMS, CHEST PAIN, FATIGUE, LETHARGY, INABILITY TO EAT OR DRINK, ETC  QUARANTINE AND ISOLATION: To help decrease the spread of COVID-19 please remain isolated if you have COVID infection or are highly suspected to have COVID infection. This means -stay home and isolate to one room in the home if you live with others. Do not share a bed or bathroom with others while ill, sanitize and wipe down all countertops and keep common areas clean and disinfected. You may discontinue isolation if you have a mild case and are asymptomatic 10 days after symptom onset as long as you have been fever free >24 hours without having to take Motrin or Tylenol. If your case is more severe (meaning you develop pneumonia or are admitted in the hospital), you may have to isolate longer.   If you have been in close contact (within 6 feet) of someone diagnosed with COVID 19, you are advised to quarantine in your home for 14 days as symptoms can develop anywhere from 2-14 days after exposure to the virus. If you develop symptoms, you  must isolate.  Most current guidelines for COVID after exposure -isolate 10 days if you ARE NOT tested for COVID as long as symptoms do not develop -isolate 7 days if you are tested and remain asymptomatic -You do not necessarily need to be tested for COVID if you have + exposure and        develop   symptoms. Just isolate at home x10 days from symptom onset During this global pandemic, CDC advises to practice social distancing, try to stay at least 35ft away from others at all times. Wear a face covering. Wash and sanitize your hands regularly and avoid going anywhere that is not  necessary.  KEEP IN MIND THAT THE COVID TEST IS NOT 100% ACCURATE AND YOU SHOULD STILL DO EVERYTHING TO PREVENT POTENTIAL SPREAD OF VIRUS TO OTHERS (WEAR MASK, WEAR GLOVES, WASH HANDS AND SANITIZE REGULARLY). IF INITIAL TEST IS NEGATIVE, THIS MAY NOT MEAN YOU ARE DEFINITELY NEGATIVE. MOST ACCURATE TESTING IS DONE 5-7 DAYS AFTER EXPOSURE.   It is not advised by CDC to get re-tested after receiving a positive COVID test since you can still test positive for weeks to months after you have already cleared the virus.   *If you have not been vaccinated for COVID, I strongly suggest you consider getting vaccinated as long as there are no contraindications.      ED Prescriptions    None     PDMP not reviewed this encounter.   Shirlee Latch, PA-C 01/15/20 1531

## 2020-01-14 NOTE — Discharge Instructions (Signed)

## 2020-03-21 ENCOUNTER — Encounter: Payer: BC Managed Care – PPO | Admitting: Family Medicine

## 2020-04-24 ENCOUNTER — Encounter: Payer: BC Managed Care – PPO | Admitting: Family Medicine

## 2020-11-15 ENCOUNTER — Ambulatory Visit: Payer: BC Managed Care – PPO | Admitting: Family Medicine

## 2020-11-27 ENCOUNTER — Ambulatory Visit: Payer: BC Managed Care – PPO | Admitting: Family Medicine

## 2020-11-29 ENCOUNTER — Other Ambulatory Visit: Payer: Self-pay

## 2020-11-29 ENCOUNTER — Ambulatory Visit
Admission: RE | Admit: 2020-11-29 | Discharge: 2020-11-29 | Disposition: A | Discharge status: Home / Self Care | Payer: BC Managed Care – PPO | Attending: Family Medicine | Admitting: Family Medicine

## 2020-11-29 ENCOUNTER — Encounter: Payer: Self-pay | Admitting: Family Medicine

## 2020-11-29 ENCOUNTER — Ambulatory Visit: Payer: BC Managed Care – PPO | Admitting: Family Medicine

## 2020-11-29 ENCOUNTER — Ambulatory Visit
Admission: RE | Admit: 2020-11-29 | Discharge: 2020-11-29 | Disposition: A | Discharge status: Home / Self Care | Payer: BC Managed Care – PPO | Source: Ambulatory Visit | Attending: Family Medicine | Admitting: Family Medicine

## 2020-11-29 VITALS — BP 131/89 | HR 81 | Ht 72.0 in | Wt 182.0 lb

## 2020-11-29 DIAGNOSIS — M25871 Other specified joint disorders, right ankle and foot: Secondary | ICD-10-CM

## 2020-11-29 DIAGNOSIS — R350 Frequency of micturition: Secondary | ICD-10-CM

## 2020-11-29 DIAGNOSIS — Z13220 Encounter for screening for lipoid disorders: Secondary | ICD-10-CM

## 2020-11-29 DIAGNOSIS — I10 Essential (primary) hypertension: Secondary | ICD-10-CM | POA: Diagnosis not present

## 2020-11-29 DIAGNOSIS — Z1159 Encounter for screening for other viral diseases: Secondary | ICD-10-CM

## 2020-11-29 DIAGNOSIS — Z23 Encounter for immunization: Secondary | ICD-10-CM

## 2020-11-29 DIAGNOSIS — R2241 Localized swelling, mass and lump, right lower limb: Secondary | ICD-10-CM | POA: Diagnosis not present

## 2020-11-29 DIAGNOSIS — M2011 Hallux valgus (acquired), right foot: Secondary | ICD-10-CM | POA: Diagnosis not present

## 2020-11-29 DIAGNOSIS — N401 Enlarged prostate with lower urinary tract symptoms: Secondary | ICD-10-CM

## 2020-11-29 DIAGNOSIS — M19071 Primary osteoarthritis, right ankle and foot: Secondary | ICD-10-CM | POA: Diagnosis not present

## 2020-11-29 LAB — URINALYSIS, ROUTINE W REFLEX MICROSCOPIC
Bilirubin, UA: NEGATIVE
Glucose, UA: NEGATIVE
Ketones, UA: NEGATIVE
Leukocytes,UA: NEGATIVE
Nitrite, UA: NEGATIVE
Protein,UA: NEGATIVE
RBC, UA: NEGATIVE
Specific Gravity, UA: 1.02 (ref 1.005–1.030)
Urobilinogen, Ur: 0.2 mg/dL (ref 0.2–1.0)
pH, UA: 6.5 (ref 5.0–7.5)

## 2020-11-29 MED ORDER — DICLOFENAC SODIUM 1 % EX GEL: 4.0000 g | Freq: Four times a day (QID) | CUTANEOUS | 3 refills | Status: DC

## 2020-11-29 NOTE — Assessment & Plan Note (Signed)
Doing well not on medicine. Continue to monitor. Call with any concerns.  

## 2020-11-29 NOTE — Progress Notes (Signed)
BP 131/89   Pulse 81   Ht 6' (1.829 m)   Wt 182 lb (82.6 kg)   BMI 24.68 kg/m    Subjective:    Patient ID: Nathaniel Howard, male    DOB: [birth year], 55 y.o.   MRN: [id number]  HPI: Nathaniel Howard is a 55 y.o. male  Chief Complaint  Patient presents with   Foot Problem    Right side, denies injury   FOOT PAIN Duration: almost a year Involved foot: right Mechanism of injury: unknown Location: big toe Onset: gradual  Severity: moderate  Quality:  aching Frequency: intermittent Radiation: no Aggravating factors:  working, tight shoes, weight bearing, and walking  Alleviating factors: loose shoes, rest  Status: worse Treatments attempted: rest, ice, heat, APAP, ibuprofen, and aleve  Relief with NSAIDs?:  mild Weakness with weight bearing or walking: no Morning stiffness: no Swelling: yes Redness: yes Bruising: no Paresthesias / decreased sensation: no  Fevers:no  Relevant past medical, surgical, family and social history reviewed and updated as indicated. Interim medical history since our last visit reviewed. Allergies and medications reviewed and updated.  Review of Systems  Constitutional: Negative.   Respiratory: Negative.    Cardiovascular: Negative.   Gastrointestinal: Negative.   Musculoskeletal:  Positive for arthralgias, gait problem and joint swelling. Negative for back pain, myalgias, neck pain and neck stiffness.  Psychiatric/Behavioral: Negative.     Per HPI unless specifically indicated above     Objective:    BP 131/89   Pulse 81   Ht 6' (1.829 m)   Wt 182 lb (82.6 kg)   BMI 24.68 kg/m   Wt Readings from Last 3 Encounters:  11/29/20 182 lb (82.6 kg)  01/14/20 182 lb (82.6 kg)  11/15/19 182 lb (82.6 kg)    Physical Exam Vitals and nursing note reviewed.  Constitutional:      General: He is not in acute distress.    Appearance: Normal appearance. He is not ill-appearing, toxic-appearing or diaphoretic.  HENT:     Head: Normocephalic  and atraumatic.     Right Ear: External ear normal.     Left Ear: External ear normal.     Nose: Nose normal.     Mouth/Throat:     Mouth: Mucous membranes are moist.     Pharynx: Oropharynx is clear.  Eyes:     General: No scleral icterus.       Right eye: No discharge.        Left eye: No discharge.     Extraocular Movements: Extraocular movements intact.     Conjunctiva/sclera: Conjunctivae normal.     Pupils: Pupils are equal, round, and reactive to light.  Cardiovascular:     Rate and Rhythm: Normal rate and regular rhythm.     Pulses: Normal pulses.     Heart sounds: Normal heart sounds. No murmur heard.   No friction rub. No gallop.  Pulmonary:     Effort: Pulmonary effort is normal. No respiratory distress.     Breath sounds: Normal breath sounds. No stridor. No wheezing, rhonchi or rales.  Chest:     Chest wall: No tenderness.  Musculoskeletal:        General: Swelling, tenderness and deformity present. Normal range of motion.     Cervical back: Normal range of motion and neck supple.     Comments: Boney hypertrophy on R 1st MCP with boney prominence going into 2nd MCP  Skin:    General: Skin is warm  and dry.     Capillary Refill: Capillary refill takes less than 2 seconds.     Coloration: Skin is not jaundiced or pale.     Findings: No bruising, erythema, lesion or rash.  Neurological:     General: No focal deficit present.     Mental Status: He is alert and oriented to person, place, and time. Mental status is at baseline.  Psychiatric:        Mood and Affect: Mood normal.        Behavior: Behavior normal.        Thought Content: Thought content normal.        Judgment: Judgment normal.    Results for orders placed or performed during the hospital encounter of 01/14/20  Resp Panel by RT-PCR (Flu A&B, Covid) Nasopharyngeal Swab   Specimen: Nasopharyngeal Swab; Nasopharyngeal(NP) swabs in vial transport medium  Result Value Ref Range   SARS Coronavirus 2 by RT  PCR NEGATIVE NEGATIVE   Influenza A by PCR NEGATIVE NEGATIVE   Influenza B by PCR NEGATIVE NEGATIVE      Assessment & Plan:   Problem List Items Addressed This Visit       Cardiovascular and Mediastinum   Essential hypertension    Doing well not on medicine. Continue to monitor. Call with any concerns.       Relevant Orders   Comprehensive metabolic panel   CBC with Differential/Platelet   TSH     Genitourinary   BPH (benign prostatic hyperplasia)    Doing well not on medicine. Continue to monitor. Call with any concerns.       Relevant Orders   Comprehensive metabolic panel   CBC with Differential/Platelet   PSA   Urinalysis, Routine w reflex microscopic   Other Visit Diagnoses     Mass of joint of right foot    -  Primary   Of unclear etiology. Loose shoes, voltaren. Will check labs and x-ray and refer to podiatry. Call with any concerns.    Relevant Orders   Uric acid   Comprehensive metabolic panel   CBC with Differential/Platelet   DG Foot Complete Right   Ambulatory referral to Podiatry   Screening for cholesterol level       Labs drawn today. Await results. Treat as needed.    Relevant Orders   Lipid Panel w/o Chol/HDL Ratio   Need for hepatitis C screening test       Labs drawn today. Await results. Treat as needed.    Relevant Orders   Hepatitis C Antibody   Needs flu shot       Flu shot given today.   Relevant Orders   Flu Vaccine QUAD 6+ mos PF IM (Fluarix Quad PF)        Follow up plan: Return ASAP Physical.

## 2020-11-30 LAB — COMPREHENSIVE METABOLIC PANEL
ALT: 29 IU/L (ref 0–44)
AST: 28 IU/L (ref 0–40)
Albumin/Globulin Ratio: 1.8 (ref 1.2–2.2)
Albumin: 4.6 g/dL (ref 3.8–4.9)
Alkaline Phosphatase: 92 IU/L (ref 44–121)
BUN/Creatinine Ratio: 14 (ref 9–20)
BUN: 12 mg/dL (ref 6–24)
Bilirubin Total: 0.7 mg/dL (ref 0.0–1.2)
CO2: 23 mmol/L (ref 20–29)
Calcium: 10.3 mg/dL — ABNORMAL HIGH (ref 8.7–10.2)
Chloride: 101 mmol/L (ref 96–106)
Creatinine, Ser: 0.85 mg/dL (ref 0.76–1.27)
Globulin, Total: 2.5 g/dL (ref 1.5–4.5)
Glucose: 87 mg/dL (ref 70–99)
Potassium: 4.6 mmol/L (ref 3.5–5.2)
Sodium: 140 mmol/L (ref 134–144)
Total Protein: 7.1 g/dL (ref 6.0–8.5)
eGFR: 103 mL/min/{1.73_m2} (ref >59)

## 2020-11-30 LAB — CBC WITH DIFFERENTIAL/PLATELET
Basophils Absolute: 0 10*3/uL (ref 0.0–0.2)
Basos: 1 %
EOS (ABSOLUTE): 0.1 10*3/uL (ref 0.0–0.4)
Eos: 1 %
Hematocrit: 55.7 % — ABNORMAL HIGH (ref 37.5–51.0)
Hemoglobin: 18.7 g/dL — ABNORMAL HIGH (ref 13.0–17.7)
Immature Grans (Abs): 0 10*3/uL (ref 0.0–0.1)
Immature Granulocytes: 0 %
Lymphocytes Absolute: 1.9 10*3/uL (ref 0.7–3.1)
Lymphs: 25 %
MCH: 32.6 pg (ref 26.6–33.0)
MCHC: 33.6 g/dL (ref 31.5–35.7)
MCV: 97 fL (ref 79–97)
Monocytes Absolute: 0.7 10*3/uL (ref 0.1–0.9)
Monocytes: 9 %
Neutrophils Absolute: 4.9 10*3/uL (ref 1.4–7.0)
Neutrophils: 64 %
Platelets: 195 10*3/uL (ref 150–450)
RBC: 5.73 x10E6/uL (ref 4.14–5.80)
RDW: 12.6 % (ref 11.6–15.4)
WBC: 7.6 10*3/uL (ref 3.4–10.8)

## 2020-11-30 LAB — URIC ACID: Uric Acid: 5.8 mg/dL (ref 3.8–8.4)

## 2020-11-30 LAB — HEPATITIS C ANTIBODY: Hep C Virus Ab: <0.1 s/co ratio (ref 0.0–0.9)

## 2020-11-30 LAB — LIPID PANEL W/O CHOL/HDL RATIO
Cholesterol, Total: 219 mg/dL — ABNORMAL HIGH (ref 100–199)
HDL: 53 mg/dL (ref >39)
LDL Chol Calc (NIH): 155 mg/dL — ABNORMAL HIGH (ref 0–99)
Triglycerides: 64 mg/dL (ref 0–149)
VLDL Cholesterol Cal: 11 mg/dL (ref 5–40)

## 2020-11-30 LAB — TSH: TSH: 1.44 u[IU]/mL (ref 0.450–4.500)

## 2020-11-30 LAB — PSA: Prostate Specific Ag, Serum: 2.4 ng/mL (ref 0.0–4.0)

## 2021-01-29 ENCOUNTER — Encounter: Payer: BC Managed Care – PPO | Admitting: Family Medicine

## 2021-03-07 ENCOUNTER — Encounter: Payer: BC Managed Care – PPO | Admitting: Family Medicine

## 2021-04-04 ENCOUNTER — Encounter: Payer: BC Managed Care – PPO | Admitting: Family Medicine

## 2021-04-04 ENCOUNTER — Telehealth (INDEPENDENT_AMBULATORY_CARE_PROVIDER_SITE_OTHER): Payer: BC Managed Care – PPO | Admitting: Family Medicine

## 2021-04-04 ENCOUNTER — Encounter: Payer: Self-pay | Admitting: Family Medicine

## 2021-04-04 DIAGNOSIS — J069 Acute upper respiratory infection, unspecified: Secondary | ICD-10-CM

## 2021-04-04 NOTE — Progress Notes (Signed)
There were no vitals taken for this visit.   Subjective:    Patient ID: Nathaniel Howard, male    DOB: 09/08/1965, 56 y.o.   MRN: [id number]  HPI: Nathaniel Howard is a 56 y.o. male  Chief Complaint  Patient presents with   Generalized Body Aches    Patient states Wednesday he was having body aches and chills. Patient feels better today. Patient request work note.    UPPER RESPIRATORY TRACT INFECTION Duration: 3 days Worst symptom: body aches Fever: chills and sweats Cough: yes Shortness of breath: no Wheezing: no Chest pain: no Chest tightness: no Chest congestion: no Nasal congestion: yes Runny nose: yes Post nasal drip: yes Sneezing: no Sore throat: no Swollen glands: no Sinus pressure: no Headache: no Face pain: no Toothache: no Ear pain: no  Ear pressure: no  Eyes red/itching:no Eye drainage/crusting: no  Vomiting: no Rash: no Fatigue: yes Sick contacts: yes Strep contacts: no  Context: better Recurrent sinusitis: no Relief with OTC cold/cough medications: no  Treatments attempted: none  Relevant past medical, surgical, family and social history reviewed and updated as indicated. Interim medical history since our last visit reviewed. Allergies and medications reviewed and updated.  Review of Systems  Constitutional: Negative.   HENT: Negative.    Respiratory: Negative.    Cardiovascular: Negative.   Gastrointestinal: Negative.   Musculoskeletal: Negative.   Psychiatric/Behavioral: Negative.     Per HPI unless specifically indicated above     Objective:    There were no vitals taken for this visit.  Wt Readings from Last 3 Encounters:  11/29/20 182 lb (82.6 kg)  01/14/20 182 lb (82.6 kg)  11/15/19 182 lb (82.6 kg)    Physical Exam Vitals and nursing note reviewed.  Constitutional:      General: He is not in acute distress.    Appearance: Normal appearance. He is not ill-appearing, toxic-appearing or diaphoretic.  HENT:     Head:  Normocephalic and atraumatic.     Right Ear: External ear normal.     Left Ear: External ear normal.     Nose: Nose normal.     Mouth/Throat:     Mouth: Mucous membranes are moist.     Pharynx: Oropharynx is clear.  Eyes:     General: No scleral icterus.       Right eye: No discharge.        Left eye: No discharge.     Conjunctiva/sclera: Conjunctivae normal.     Pupils: Pupils are equal, round, and reactive to light.  Pulmonary:     Effort: Pulmonary effort is normal. No respiratory distress.     Comments: Speaking in full sentences Musculoskeletal:        General: Normal range of motion.     Cervical back: Normal range of motion.  Skin:    Coloration: Skin is not jaundiced or pale.     Findings: No bruising, erythema, lesion or rash.  Neurological:     Mental Status: He is alert and oriented to person, place, and time. Mental status is at baseline.  Psychiatric:        Mood and Affect: Mood normal.        Behavior: Behavior normal.        Thought Content: Thought content normal.        Judgment: Judgment normal.    Results for orders placed or performed in visit on 11/29/20  Uric acid  Result Value Ref Range   Uric  Acid 5.8 3.8 - 8.4 mg/dL  Comprehensive metabolic panel  Result Value Ref Range   Glucose 87 70 - 99 mg/dL   BUN 12 6 - 24 mg/dL   Creatinine, Ser 0.85 0.76 - 1.27 mg/dL   eGFR 103 >59 mL/min/1.73   BUN/Creatinine Ratio 14 9 - 20   Sodium 140 134 - 144 mmol/L   Potassium 4.6 3.5 - 5.2 mmol/L   Chloride 101 96 - 106 mmol/L   CO2 23 20 - 29 mmol/L   Calcium 10.3 (H) 8.7 - 10.2 mg/dL   Total Protein 7.1 6.0 - 8.5 g/dL   Albumin 4.6 3.8 - 4.9 g/dL   Globulin, Total 2.5 1.5 - 4.5 g/dL   Albumin/Globulin Ratio 1.8 1.2 - 2.2   Bilirubin Total 0.7 0.0 - 1.2 mg/dL   Alkaline Phosphatase 92 44 - 121 IU/L   AST 28 0 - 40 IU/L   ALT 29 0 - 44 IU/L  CBC with Differential/Platelet  Result Value Ref Range   WBC 7.6 3.4 - 10.8 x10E3/uL   RBC 5.73 4.14 - 5.80  x10E6/uL   Hemoglobin 18.7 (H) 13.0 - 17.7 g/dL   Hematocrit 55.7 (H) 37.5 - 51.0 %   MCV 97 79 - 97 fL   MCH 32.6 26.6 - 33.0 pg   MCHC 33.6 31.5 - 35.7 g/dL   RDW 12.6 11.6 - 15.4 %   Platelets 195 150 - 450 x10E3/uL   Neutrophils 64 Not Estab. %   Lymphs 25 Not Estab. %   Monocytes 9 Not Estab. %   Eos 1 Not Estab. %   Basos 1 Not Estab. %   Neutrophils Absolute 4.9 1.4 - 7.0 x10E3/uL   Lymphocytes Absolute 1.9 0.7 - 3.1 x10E3/uL   Monocytes Absolute 0.7 0.1 - 0.9 x10E3/uL   EOS (ABSOLUTE) 0.1 0.0 - 0.4 x10E3/uL   Basophils Absolute 0.0 0.0 - 0.2 x10E3/uL   Immature Granulocytes 0 Not Estab. %   Immature Grans (Abs) 0.0 0.0 - 0.1 x10E3/uL  Lipid Panel w/o Chol/HDL Ratio  Result Value Ref Range   Cholesterol, Total 219 (H) 100 - 199 mg/dL   Triglycerides 64 0 - 149 mg/dL   HDL 53 >39 mg/dL   VLDL Cholesterol Cal 11 5 - 40 mg/dL   LDL Chol Calc (NIH) 155 (H) 0 - 99 mg/dL  PSA  Result Value Ref Range   Prostate Specific Ag, Serum 2.4 0.0 - 4.0 ng/mL  TSH  Result Value Ref Range   TSH 1.440 0.450 - 4.500 uIU/mL  Urinalysis, Routine w reflex microscopic  Result Value Ref Range   Specific Gravity, UA 1.020 1.005 - 1.030   pH, UA 6.5 5.0 - 7.5   Color, UA Yellow Yellow   Appearance Ur Clear Clear   Leukocytes,UA Negative Negative   Protein,UA Negative Negative/Trace   Glucose, UA Negative Negative   Ketones, UA Negative Negative   RBC, UA Negative Negative   Bilirubin, UA Negative Negative   Urobilinogen, Ur 0.2 0.2 - 1.0 mg/dL   Nitrite, UA Negative Negative  Hepatitis C Antibody  Result Value Ref Range   Hep C Virus Ab <0.1 0.0 - 0.9 s/co ratio      Assessment & Plan:   Problem List Items Addressed This Visit   None Visit Diagnoses     Upper respiratory tract infection, unspecified type    -  Primary   Feeling better. Resolving on it's own. Note given. Call with any concerns.  Follow up plan: Return if symptoms worsen or fail to  improve.    This visit was completed via video visit through MyChart due to the restrictions of the COVID-19 pandemic. All issues as above were discussed and addressed. Physical exam was done as above through visual confirmation on video through MyChart. If it was felt that the patient should be evaluated in the office, they were directed there. The patient verbally consented to this visit. Location of the patient: home Location of the provider: work Those involved with this call:  Provider: Park Liter, DO CMA: Louanna Raw, [place] Desk/Registration: [employer]  Time spent on call:  10 minutes with patient face to face via video conference. More than 50% of this time was spent in counseling and coordination of care. 25 minutes total spent in review of patient's record and preparation of their chart.

## 2021-07-25 ENCOUNTER — Telehealth: Payer: BC Managed Care – PPO | Admitting: Physician Assistant

## 2021-07-25 DIAGNOSIS — J069 Acute upper respiratory infection, unspecified: Secondary | ICD-10-CM

## 2021-07-25 DIAGNOSIS — B9689 Other specified bacterial agents as the cause of diseases classified elsewhere: Secondary | ICD-10-CM | POA: Diagnosis not present

## 2021-07-25 MED ORDER — AZITHROMYCIN 250 MG PO TABS: ORAL_TABLET | ORAL | 0 refills | Status: AC

## 2021-07-25 MED ORDER — BENZONATATE 100 MG PO CAPS: 100.0000 mg | ORAL_CAPSULE | Freq: Three times a day (TID) | ORAL | 0 refills | Status: DC | PRN

## 2021-07-25 NOTE — Progress Notes (Signed)
We are sorry that you are not feeling well.  Here is how we plan to help!  Based on your presentation I believe you most likely have A cough due to bacteria.  When patients have a fever and a productive cough with a change in color or increased sputum production, we are concerned about bacterial bronchitis.  If left untreated it can progress to pneumonia.  If your symptoms do not improve with your treatment plan it is important that you contact your provider.   I have prescribed Azithromyin 250 mg: two tablets now and then one tablet daily for 4 additonal days    In addition you may use A non-prescription cough medication called Mucinex DM: take 2 tablets every 12 hours. and A prescription cough medication called Tessalon Perles 100mg. You may take 1-2 capsules every 8 hours as needed for your cough.   From your responses in the eVisit questionnaire you describe inflammation in the upper respiratory tract which is causing a significant cough.  This is commonly called Bronchitis and has four common causes:   Allergies Viral Infections Acid Reflux Bacterial Infection Allergies, viruses and acid reflux are treated by controlling symptoms or eliminating the cause. An example might be a cough caused by taking certain blood pressure medications. You stop the cough by changing the medication. Another example might be a cough caused by acid reflux. Controlling the reflux helps control the cough.  USE OF BRONCHODILATOR ("RESCUE") INHALERS: There is a risk from using your bronchodilator too frequently.  The risk is that over-reliance on a medication which only relaxes the muscles surrounding the breathing tubes can reduce the effectiveness of medications prescribed to reduce swelling and congestion of the tubes themselves.  Although you feel brief relief from the bronchodilator inhaler, your asthma may actually be worsening with the tubes becoming more swollen and filled with mucus.  This can delay other  crucial treatments, such as oral steroid medications. If you need to use a bronchodilator inhaler daily, several times per day, you should discuss this with your provider.  There are probably better treatments that could be used to keep your asthma under control.     HOME CARE Only take medications as instructed by your medical team. Complete the entire course of an antibiotic. Drink plenty of fluids and get plenty of rest. Avoid close contacts especially the very young and the elderly Cover your mouth if you cough or cough into your sleeve. Always remember to wash your hands A steam or ultrasonic humidifier can help congestion.   GET HELP RIGHT AWAY IF: You develop worsening fever. You become short of breath You cough up blood. Your symptoms persist after you have completed your treatment plan MAKE SURE YOU  Understand these instructions. Will watch your condition. Will get help right away if you are not doing well or get worse.    Thank you for choosing an e-visit.  Your e-visit answers were reviewed by a board certified advanced clinical practitioner to complete your personal care plan. Depending upon the condition, your plan could have included both over the counter or prescription medications.  Please review your pharmacy choice. Make sure the pharmacy is open so you can pick up prescription now. If there is a problem, you may contact your provider through MyChart messaging and have the prescription routed to another pharmacy.  Your safety is important to us. If you have drug allergies check your prescription carefully.   For the next 24 hours you can use   MyChart to ask questions about today's visit, request a non-urgent call back, or ask for a work or school excuse. You will get an email in the next two days asking about your experience. I hope that your e-visit has been valuable and will speed your recovery.  I provided 5 minutes of non face-to-face time during this encounter  for chart review and documentation.   

## 2021-11-27 ENCOUNTER — Ambulatory Visit: Payer: Self-pay

## 2021-11-27 NOTE — Telephone Encounter (Signed)
Message from Nani Ravens sent at 11/27/2021 12:41 PM EDT  Summary: dry cough, congestion, cold symptoms   Pt's spouse Chrys Racer called in to schedule pt an appt. Pt is experiencing a dry cough and cold symptoms. Spouse says that she is calling because pt is at work and is unable to talk on the phone. Spouse would just like to know if pt could be worked in and seen by a provider tomorrow after 10:30?    Please assist further.         Called pt's wife Chrys Racer, back and appt made for tomorrow am.  Reason for Disposition  Requesting regular office appointment  Answer Assessment - Initial Assessment Questions Pt's wife called asking for appt for spouse for cough, congestion, cold sx.  Answer Assessment - Initial Assessment Questions 1. REASON FOR CALL or QUESTION: "What is your reason for calling today?" or "How can I best help you?" or "What question do you have that I can help answer?"     *No Answer*Message from Nani Ravens sent at 11/27/2021 12:41 PM EDT  Summary: dry cough, congestion, cold symptoms   Pt's spouse Chrys Racer called in to schedule pt an appt. Pt is experiencing a dry cough and cold symptoms. Spouse says that she is calling because pt is at work and is unable to talk on the phone. Spouse would just like to know if pt could be worked in and seen by a provider tomorrow after 10:30?    Please assist further.  Protocols used: No Contact or Duplicate Contact Call-A-AH, Information Only Call - No Triage-A-AH

## 2021-11-28 ENCOUNTER — Ambulatory Visit (INDEPENDENT_AMBULATORY_CARE_PROVIDER_SITE_OTHER): Payer: BC Managed Care – PPO | Admitting: Nurse Practitioner

## 2021-11-28 ENCOUNTER — Encounter: Payer: Self-pay | Admitting: Nurse Practitioner

## 2021-11-28 VITALS — BP 132/76 | HR 80 | Temp 97.7°F | Ht 72.0 in | Wt 173.1 lb

## 2021-11-28 DIAGNOSIS — J441 Chronic obstructive pulmonary disease with (acute) exacerbation: Secondary | ICD-10-CM

## 2021-11-28 DIAGNOSIS — F17210 Nicotine dependence, cigarettes, uncomplicated: Secondary | ICD-10-CM | POA: Insufficient documentation

## 2021-11-28 DIAGNOSIS — J449 Chronic obstructive pulmonary disease, unspecified: Secondary | ICD-10-CM | POA: Insufficient documentation

## 2021-11-28 MED ORDER — ALBUTEROL SULFATE HFA 108 (90 BASE) MCG/ACT IN AERS: 2.0000 | INHALATION_SPRAY | Freq: Four times a day (QID) | RESPIRATORY_TRACT | 2 refills | Status: DC | PRN

## 2021-11-28 MED ORDER — PREDNISONE 20 MG PO TABS: 40.0000 mg | ORAL_TABLET | Freq: Every day | ORAL | 0 refills | Status: AC

## 2021-11-28 MED ORDER — HYDROCOD POLI-CHLORPHE POLI ER 10-8 MG/5ML PO SUER: 5.0000 mL | Freq: Every evening | ORAL | 0 refills | Status: DC | PRN

## 2021-11-28 MED ORDER — AMOXICILLIN-POT CLAVULANATE 875-125 MG PO TABS: 1.0000 | ORAL_TABLET | Freq: Two times a day (BID) | ORAL | 0 refills | Status: AC

## 2021-11-28 NOTE — Assessment & Plan Note (Signed)
Acute, suspect underlying COPD -- long time smoker.  Discussed lung CA screening with him today and he will discuss further with PCP at physical.  At this time treat acute exacerbation with Albuterol inhaler PRN, Tussionex QHS PRN, Augmentin BID for 7 days, and Prednisone 40 MG daily for 5 days.  Educated him on medication regimen.  Recommend: - Increased rest - Increasing Fluids - Acetaminophen as needed for fever/pain.  - Salt water gargling, chloraseptic spray and throat lozenges - Mucinex.  - Humidifying the air. Return to office as needed for worsening or ongoing.

## 2021-11-28 NOTE — Patient Instructions (Signed)

## 2021-11-28 NOTE — Assessment & Plan Note (Signed)
I have recommended complete cessation of tobacco use. I have discussed various options available for assistance with tobacco cessation including over the counter methods (Nicotine gum, patch and lozenges). We also discussed prescription options (Chantix, Nicotine Inhaler / Nasal Spray). The patient is not interested in pursuing any prescription tobacco cessation options at this time.  
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
## 2022-02-18 ENCOUNTER — Telehealth: Payer: BC Managed Care – PPO | Admitting: Family

## 2022-02-18 DIAGNOSIS — R6889 Other general symptoms and signs: Secondary | ICD-10-CM | POA: Diagnosis not present

## 2022-02-18 MED ORDER — OSELTAMIVIR PHOSPHATE 75 MG PO CAPS: 75.0000 mg | ORAL_CAPSULE | Freq: Two times a day (BID) | ORAL | 0 refills | Status: DC

## 2022-02-18 MED ORDER — BENZONATATE 100 MG PO CAPS: 100.0000 mg | ORAL_CAPSULE | Freq: Three times a day (TID) | ORAL | 0 refills | Status: DC | PRN

## 2022-02-18 NOTE — Progress Notes (Signed)
Virtual Visit Consent   [name], you are scheduled for a virtual visit with a [facility] provider today. Just as with appointments in the office, your consent must be obtained to participate. Your consent will be active for this visit and any virtual visit you may have with one of our providers in the next 365 days. If you have a MyChart account, a copy of this consent can be sent to you electronically.  As this is a virtual visit, video technology does not allow for your provider to perform a traditional examination. This may limit your provider's ability to fully assess your condition. If your provider identifies any concerns that need to be evaluated in person or the need to arrange testing (such as labs, EKG, etc.), we will make arrangements to do so. Although advances in technology are sophisticated, we cannot ensure that it will always work on either your end or our end. If the connection with a video visit is poor, the visit may have to be switched to a telephone visit. With either a video or telephone visit, we are not always able to ensure that we have a secure connection.  By engaging in this virtual visit, you consent to the provision of healthcare and authorize for your insurance to be billed (if applicable) for the services provided during this visit. Depending on your insurance coverage, you may receive a charge related to this service.  I need to obtain your verbal consent now. Are you willing to proceed with your visit today? [name] has provided verbal consent on 02/18/2022 for a virtual visit (video or telephone). Evelina Dun, FNP  Date: 02/18/2022 12:22 PM  Virtual Visit via Video Note   I, Evelina Dun, connected with  Nathaniel Howard  ([id number], [id number]) on 02/18/22 at 12:30 PM EST by a video-enabled telemedicine application and verified that I am speaking with the correct person using two identifiers.  Location: Patient: Virtual Visit Location Patient:  Home Provider: Virtual Visit Location Provider: Home Office   I discussed the limitations of evaluation and management by telemedicine and the availability of in person appointments. The patient expressed understanding and agreed to proceed.    History of Present Illness: Nathaniel Howard is a 57 y.o. who identifies as a male who was assigned male at birth, and is being seen today for flu like symptoms that started yesterday. Did a home COVID test that was negative.   HPI: URI  This is a new problem. The current episode started yesterday. The problem has been gradually worsening. Maximum temperature: 99.6. Associated symptoms include congestion, coughing, joint pain (body aches), nausea, rhinorrhea, sinus pain, sneezing and a sore throat. Pertinent negatives include no ear pain or vomiting. He has tried decongestant, acetaminophen and increased fluids for the symptoms. The treatment provided mild relief.    Problems:  Patient Active Problem List   Diagnosis Date Noted   Nicotine dependence, cigarettes, uncomplicated 69/67/8938   COPD exacerbation ([place]) 11/28/2021   Essential hypertension 05/16/2018   BPH (benign prostatic hyperplasia) 11/20/2016   Kidney stone     Allergies: No Known Allergies Medications:  Current Outpatient Medications:    benzonatate (TESSALON PERLES) 100 MG capsule, Take 1 capsule (100 mg total) by mouth 3 (three) times daily as needed., Disp: 20 capsule, Rfl: 0   oseltamivir (TAMIFLU) 75 MG capsule, Take 1 capsule (75 mg total) by mouth 2 (two) times daily., Disp: 10 capsule, Rfl: 0   albuterol (VENTOLIN HFA) 108 (90  Base) MCG/ACT inhaler, Inhale 2 puffs into the lungs every 6 (six) hours as needed for wheezing or shortness of breath., Disp: 8 g, Rfl: 2   chlorpheniramine-HYDROcodone (TUSSIONEX) 10-8 MG/5ML, Take 5 mLs by mouth at bedtime as needed for cough., Disp: 70 mL, Rfl: 0   diclofenac Sodium (VOLTAREN) 1 % GEL, Apply 4 g topically 4 (four) times daily. (Patient  not taking: Reported on 11/28/2021), Disp: 100 g, Rfl: 3   fluticasone (FLONASE) 50 MCG/ACT nasal spray, Place 2 sprays into both nostrils daily. (Patient not taking: Reported on 11/28/2021), Disp: , Rfl:    ibuprofen (ADVIL) 600 MG tablet, Take 1 tablet (600 mg total) by mouth every 6 (six) hours as needed. (Patient not taking: Reported on 11/28/2021), Disp: 30 tablet, Rfl: 0  Observations/Objective: Patient is well-developed, well-nourished in no acute distress.  Resting comfortably  at home.  Head is normocephalic, atraumatic.  No labored breathing.  Speech is clear and coherent with logical content.  Patient is alert and oriented at baseline.    Assessment and Plan: 1. Flu-like symptoms - oseltamivir (TAMIFLU) 75 MG capsule; Take 1 capsule (75 mg total) by mouth 2 (two) times daily.  Dispense: 10 capsule; Refill: 0 - benzonatate (TESSALON PERLES) 100 MG capsule; Take 1 capsule (100 mg total) by mouth 3 (three) times daily as needed.  Dispense: 20 capsule; Refill: 0  Rest Force fluids  Tylenol  Droplet precautions  Follow up if symptoms worsen or do not improve   Follow Up Instructions: I discussed the assessment and treatment plan with the patient. The patient was provided an opportunity to ask questions and all were answered. The patient agreed with the plan and demonstrated an understanding of the instructions.  A copy of instructions were sent to the patient via MyChart unless otherwise noted below.     The patient was advised to call back or seek an in-person evaluation if the symptoms worsen or if the condition fails to improve as anticipated.  Time:  I spent 6 minutes with the patient via telehealth technology discussing the above problems/concerns.    Evelina Dun, FNP

## 2022-02-27 ENCOUNTER — Encounter: Payer: BC Managed Care – PPO | Admitting: Family Medicine

## 2022-05-15 ENCOUNTER — Encounter: Payer: Self-pay | Admitting: Family Medicine

## 2022-05-15 ENCOUNTER — Ambulatory Visit (INDEPENDENT_AMBULATORY_CARE_PROVIDER_SITE_OTHER): Payer: BC Managed Care – PPO | Admitting: Family Medicine

## 2022-05-15 VITALS — BP 136/85 | HR 89 | Temp 98.4°F | Ht 71.0 in | Wt 180.1 lb

## 2022-05-15 DIAGNOSIS — N401 Enlarged prostate with lower urinary tract symptoms: Secondary | ICD-10-CM

## 2022-05-15 DIAGNOSIS — Z23 Encounter for immunization: Secondary | ICD-10-CM | POA: Diagnosis not present

## 2022-05-15 DIAGNOSIS — Z789 Other specified health status: Secondary | ICD-10-CM

## 2022-05-15 DIAGNOSIS — I10 Essential (primary) hypertension: Secondary | ICD-10-CM | POA: Diagnosis not present

## 2022-05-15 DIAGNOSIS — N200 Calculus of kidney: Secondary | ICD-10-CM | POA: Diagnosis not present

## 2022-05-15 DIAGNOSIS — R350 Frequency of micturition: Secondary | ICD-10-CM | POA: Diagnosis not present

## 2022-05-15 DIAGNOSIS — B351 Tinea unguium: Secondary | ICD-10-CM

## 2022-05-15 DIAGNOSIS — J410 Simple chronic bronchitis: Secondary | ICD-10-CM

## 2022-05-15 DIAGNOSIS — Z1211 Encounter for screening for malignant neoplasm of colon: Secondary | ICD-10-CM

## 2022-05-15 DIAGNOSIS — Z0000 Encounter for general adult medical examination without abnormal findings: Secondary | ICD-10-CM

## 2022-05-15 DIAGNOSIS — M545 Low back pain, unspecified: Secondary | ICD-10-CM

## 2022-05-15 LAB — URINALYSIS, ROUTINE W REFLEX MICROSCOPIC
Bilirubin, UA: NEGATIVE
Glucose, UA: NEGATIVE
Ketones, UA: NEGATIVE
Leukocytes,UA: NEGATIVE
Nitrite, UA: NEGATIVE
Protein,UA: NEGATIVE
RBC, UA: NEGATIVE
Specific Gravity, UA: 1.01 (ref 1.005–1.030)
Urobilinogen, Ur: 0.2 mg/dL (ref 0.2–1.0)
pH, UA: 7 (ref 5.0–7.5)

## 2022-05-15 LAB — MICROALBUMIN, URINE WAIVED
Creatinine, Urine Waived: 10 mg/dL (ref 10–300)
Microalb, Ur Waived: 10 mg/L (ref 0–19)
Microalb/Creat Ratio: <30 mg/g (ref <30)

## 2022-05-15 MED ORDER — CICLOPIROX 8 % EX SOLN: Freq: Every day | CUTANEOUS | 0 refills | Status: AC

## 2022-05-15 NOTE — Assessment & Plan Note (Signed)
Doing well off medicine. Will check labs and microalbumin. Continue to monitor. Call with any concerns.

## 2022-05-15 NOTE — Assessment & Plan Note (Signed)
Having some low back pain- will check labs and KUB to see if stones acting up again. Continue to monitor.

## 2022-05-15 NOTE — Assessment & Plan Note (Signed)
Doing well not on medicine. Rechecking labs today. Await results. Treat as needed.

## 2022-05-15 NOTE — Assessment & Plan Note (Signed)
Doing well off medicine. Continue to monitor. Call with any concerns.  

## 2022-05-15 NOTE — Assessment & Plan Note (Signed)
Will treat with penlac. Call with any concerns. Continue to monitor.

## 2022-05-15 NOTE — Progress Notes (Signed)
BP 136/85 (BP Location: Left Arm, Cuff Size: Normal)   Pulse 89   Temp 98.4 F (36.9 C) (Oral)   Ht 5\' 11"  (1.803 m)   Wt 180 lb 1.6 oz (81.7 kg)   SpO2 97%   BMI 25.12 kg/m    Subjective:    Patient ID: Nathaniel Howard, male    DOB: August 21, 1965, 57 y.o.   MRN: [id number]  HPI: Nathaniel Howard is a 57 y.o. male presenting on 05/15/2022 for comprehensive medical examination. Current medical complaints include:  BACK PAIN Duration: couple of months Mechanism of injury: no trauma Location: bilateral and low back Onset: gradual Severity: mild Quality: nagging pain Frequency: intermittent Radiation: none Aggravating factors: laying down, drinking beer, standing up Alleviating factors: nothing Status: better Treatments attempted: none  Relief with NSAIDs?: No NSAIDs Taken Nighttime pain:  no Paresthesias / decreased sensation:  no Bowel / bladder incontinence:  no Fevers:  no Dysuria / urinary frequency:  yes  HYPERTENSION  Hypertension status: controlled - not on medicine Satisfied with current treatment? yes Duration of hypertension: chronic BP monitoring frequency:  not checking BP medication side effects:  N/A Medication compliance: not on anything Aspirin: no Recurrent headaches: no Visual changes: no Palpitations: no Dyspnea: no Chest pain: no Lower extremity edema: no Dizzy/lightheaded: no  He currently lives with: wife Interim Problems from his last visit: no  Depression Screen done today and results listed below:     05/15/2022   11:13 AM 11/28/2021    9:26 AM 11/29/2020   11:16 AM 11/29/2020   10:39 AM 02/08/2019    9:47 AM  Depression screen PHQ 2/9  Decreased Interest 0 0 0 0 0  Down, Depressed, Hopeless 0 0 0 0 0  PHQ - 2 Score 0 0 0 0 0  Altered sleeping 0 0 0 0 0  Tired, decreased energy 1 0 0 0 1  Change in appetite 0 0 0 0 0  Feeling bad or failure about yourself  0 0 0 0 0  Trouble concentrating 0 0 0 0 0  Moving slowly or  fidgety/restless 0 0 0 0 0  Suicidal thoughts 0 0 0 0 0  PHQ-9 Score 1 0 0 0 1  Difficult doing work/chores Not difficult at all Not difficult at all  Not difficult at all     Past Medical History:  Past Medical History:  Diagnosis Date   Kidney stone    Low back pain     Surgical History:  Past Surgical History:  Procedure Laterality Date   COLONOSCOPY WITH PROPOFOL N/A 04/10/2019   Procedure: COLONOSCOPY WITH BIOPSY;  Surgeon: Jonathon Bellows, MD;  Location: [facility];  Service: Endoscopy;  Laterality: N/A;   EXTRACORPOREAL SHOCK WAVE LITHOTRIPSY Right 12/06/2014   Procedure: EXTRACORPOREAL SHOCK WAVE LITHOTRIPSY (ESWL);  Surgeon: Collier Flowers, MD;  Location: [facility];  Service: Urology;  Laterality: Right;   LITHOTRIPSY Right    POLYPECTOMY N/A 04/10/2019   Procedure: POLYPECTOMY;  Surgeon: Jonathon Bellows, MD;  Location: [facility];  Service: Endoscopy;  Laterality: N/A;    Medications:  Current Outpatient Medications on File Prior to Visit  Medication Sig   fluticasone (FLONASE) 50 MCG/ACT nasal spray Place 2 sprays into both nostrils daily.   No current facility-administered medications on file prior to visit.    Allergies:  No Known Allergies  Social History:  Social History   Socioeconomic History   Marital status: Married    Spouse name:  Not on file   Number of children: Not on file   Years of education: Not on file   Highest education level: Not on file  Occupational History   Not on file  Tobacco Use   Smoking status: Every Day    Packs/day: .5    Types: Cigarettes   Smokeless tobacco: Former    Types: Nurse, children's Use: Never used  Substance and Sexual Activity   Alcohol use: Yes    Alcohol/week: 18.0 standard drinks of alcohol    Types: 18 Cans of beer per week    Comment: daily   Drug use: Yes    Frequency: 8.0 times per week    Types: Marijuana    Comment: 1-2 times a day 3-4 days per week   Sexual activity: Yes     Partners: Male    Birth control/protection: None  Other Topics Concern   Not on file  Social History Narrative   Not on file   Social Determinants of Health   Financial Resource Strain: Not on file  Food Insecurity: Not on file  Transportation Needs: Not on file  Physical Activity: Not on file  Stress: Not on file  Social Connections: Not on file  Intimate Partner Violence: Not on file   Social History   Tobacco Use  Smoking Status Every Day   Packs/day: .5   Types: Cigarettes  Smokeless Tobacco Former   Types: Loss adjuster, chartered   Social History   Substance and Sexual Activity  Alcohol Use Yes   Alcohol/week: 18.0 standard drinks of alcohol   Types: 18 Cans of beer per week   Comment: daily    Family History:  Family History  Problem Relation Age of Onset   Nephrolithiasis Father    Cancer Father        Near Brain   Stroke Father    Factor V Leiden deficiency Mother    Hypertension Maternal Aunt    Diabetes Maternal Aunt    Heart attack Maternal Aunt    Hypertension Maternal Uncle    Diabetes Maternal Uncle    Heart disease Paternal Aunt    Hypertension Maternal Grandmother    Varicose Veins Maternal Grandmother    Stroke Maternal Grandmother    Cancer Paternal Grandmother        Liver   Nephrolithiasis Paternal Grandmother    Seizures Paternal Grandfather    Prostate cancer Neg Hx    Sickle cell trait Neg Hx    Kidney cancer Neg Hx     Past medical history, surgical history, medications, allergies, family history and social history reviewed with patient today and changes made to appropriate areas of the chart.   Review of Systems  Constitutional:  Positive for diaphoresis. Negative for chills, fever, malaise/fatigue and weight loss.  HENT: Negative.    Eyes: Negative.   Respiratory:  Positive for cough. Negative for hemoptysis, sputum production, shortness of breath and wheezing.   Cardiovascular: Negative.   Gastrointestinal: Negative.   Genitourinary:  Negative.   Musculoskeletal: Negative.   Skin: Negative.   Neurological: Negative.   Endo/Heme/Allergies: Negative.   Psychiatric/Behavioral: Negative.     All other ROS negative except what is listed above and in the HPI.      Objective:    BP 136/85 (BP Location: Left Arm, Cuff Size: Normal)   Pulse 89   Temp 98.4 F (36.9 C) (Oral)   Ht 5\' 11"  (1.803 m)   Wt 180 lb 1.6  oz (81.7 kg)   SpO2 97%   BMI 25.12 kg/m   Wt Readings from Last 3 Encounters:  05/15/22 180 lb 1.6 oz (81.7 kg)  11/28/21 173 lb 1.6 oz (78.5 kg)  11/29/20 182 lb (82.6 kg)    Physical Exam Vitals and nursing note reviewed.  Constitutional:      General: He is not in acute distress.    Appearance: Normal appearance. He is normal weight. He is not ill-appearing, toxic-appearing or diaphoretic.  HENT:     Head: Normocephalic and atraumatic.     Right Ear: Tympanic membrane, ear canal and external ear normal. There is no impacted cerumen.     Left Ear: Tympanic membrane, ear canal and external ear normal. There is no impacted cerumen.     Nose: Nose normal. No congestion or rhinorrhea.     Mouth/Throat:     Mouth: Mucous membranes are moist.     Pharynx: Oropharynx is clear. No oropharyngeal exudate or posterior oropharyngeal erythema.  Eyes:     General: No scleral icterus.       Right eye: No discharge.        Left eye: No discharge.     Extraocular Movements: Extraocular movements intact.     Conjunctiva/sclera: Conjunctivae normal.     Pupils: Pupils are equal, round, and reactive to light.  Neck:     Vascular: No carotid bruit.  Cardiovascular:     Rate and Rhythm: Normal rate and regular rhythm.     Pulses: Normal pulses.     Heart sounds: No murmur heard.    No friction rub. No gallop.  Pulmonary:     Effort: Pulmonary effort is normal. No respiratory distress.     Breath sounds: Normal breath sounds. No stridor. No wheezing, rhonchi or rales.  Chest:     Chest wall: No tenderness.   Abdominal:     General: Abdomen is flat. Bowel sounds are normal. There is no distension.     Palpations: Abdomen is soft. There is no mass.     Tenderness: There is no abdominal tenderness. There is no right CVA tenderness, left CVA tenderness, guarding or rebound.     Hernia: No hernia is present.  Genitourinary:    Comments: Genital exam deferred with shared decision making Musculoskeletal:        General: No swelling, tenderness, deformity or signs of injury.     Cervical back: Normal range of motion and neck supple. No rigidity. No muscular tenderness.     Right lower leg: No edema.     Left lower leg: No edema.  Lymphadenopathy:     Cervical: No cervical adenopathy.  Skin:    General: Skin is warm and dry.     Capillary Refill: Capillary refill takes less than 2 seconds.     Coloration: Skin is not jaundiced or pale.     Findings: No bruising, erythema, lesion or rash.     Comments: Thickened discolored nails on his R hand  Neurological:     General: No focal deficit present.     Mental Status: He is alert and oriented to person, place, and time.     Cranial Nerves: No cranial nerve deficit.     Sensory: No sensory deficit.     Motor: No weakness.     Coordination: Coordination normal.     Gait: Gait normal.     Deep Tendon Reflexes: Reflexes normal.  Psychiatric:        Mood  and Affect: Mood normal.        Behavior: Behavior normal.        Thought Content: Thought content normal.        Judgment: Judgment normal.     Results for orders placed or performed in visit on 11/29/20  Uric acid  Result Value Ref Range   Uric Acid 5.8 3.8 - 8.4 mg/dL  Comprehensive metabolic panel  Result Value Ref Range   Glucose 87 70 - 99 mg/dL   BUN 12 6 - 24 mg/dL   Creatinine, Ser 0.85 0.76 - 1.27 mg/dL   eGFR 103 >59 mL/min/1.73   BUN/Creatinine Ratio 14 9 - 20   Sodium 140 134 - 144 mmol/L   Potassium 4.6 3.5 - 5.2 mmol/L   Chloride 101 96 - 106 mmol/L   CO2 23 20 - 29  mmol/L   Calcium 10.3 (H) 8.7 - 10.2 mg/dL   Total Protein 7.1 6.0 - 8.5 g/dL   Albumin 4.6 3.8 - 4.9 g/dL   Globulin, Total 2.5 1.5 - 4.5 g/dL   Albumin/Globulin Ratio 1.8 1.2 - 2.2   Bilirubin Total 0.7 0.0 - 1.2 mg/dL   Alkaline Phosphatase 92 44 - 121 IU/L   AST 28 0 - 40 IU/L   ALT 29 0 - 44 IU/L  CBC with Differential/Platelet  Result Value Ref Range   WBC 7.6 3.4 - 10.8 x10E3/uL   RBC 5.73 4.14 - 5.80 x10E6/uL   Hemoglobin 18.7 (H) 13.0 - 17.7 g/dL   Hematocrit 55.7 (H) 37.5 - 51.0 %   MCV 97 79 - 97 fL   MCH 32.6 26.6 - 33.0 pg   MCHC 33.6 31.5 - 35.7 g/dL   RDW 12.6 11.6 - 15.4 %   Platelets 195 150 - 450 x10E3/uL   Neutrophils 64 Not Estab. %   Lymphs 25 Not Estab. %   Monocytes 9 Not Estab. %   Eos 1 Not Estab. %   Basos 1 Not Estab. %   Neutrophils Absolute 4.9 1.4 - 7.0 x10E3/uL   Lymphocytes Absolute 1.9 0.7 - 3.1 x10E3/uL   Monocytes Absolute 0.7 0.1 - 0.9 x10E3/uL   EOS (ABSOLUTE) 0.1 0.0 - 0.4 x10E3/uL   Basophils Absolute 0.0 0.0 - 0.2 x10E3/uL   Immature Granulocytes 0 Not Estab. %   Immature Grans (Abs) 0.0 0.0 - 0.1 x10E3/uL  Lipid Panel w/o Chol/HDL Ratio  Result Value Ref Range   Cholesterol, Total 219 (H) 100 - 199 mg/dL   Triglycerides 64 0 - 149 mg/dL   HDL 53 >39 mg/dL   VLDL Cholesterol Cal 11 5 - 40 mg/dL   LDL Chol Calc (NIH) 155 (H) 0 - 99 mg/dL  PSA  Result Value Ref Range   Prostate Specific Ag, Serum 2.4 0.0 - 4.0 ng/mL  TSH  Result Value Ref Range   TSH 1.440 0.450 - 4.500 uIU/mL  Urinalysis, Routine w reflex microscopic  Result Value Ref Range   Specific Gravity, UA 1.020 1.005 - 1.030   pH, UA 6.5 5.0 - 7.5   Color, UA Yellow Yellow   Appearance Ur Clear Clear   Leukocytes,UA Negative Negative   Protein,UA Negative Negative/Trace   Glucose, UA Negative Negative   Ketones, UA Negative Negative   RBC, UA Negative Negative   Bilirubin, UA Negative Negative   Urobilinogen, Ur 0.2 0.2 - 1.0 mg/dL   Nitrite, UA Negative  Negative  Hepatitis C Antibody  Result Value Ref Range   Hep C Virus  Ab <0.1 0.0 - 0.9 s/co ratio      Assessment & Plan:   Problem List Items Addressed This Visit       Cardiovascular and Mediastinum   Essential hypertension    Doing well off medicine. Will check labs and microalbumin. Continue to monitor. Call with any concerns.       Relevant Orders   Microalbumin, Urine Waived     Respiratory   COPD (chronic obstructive pulmonary disease) ([place])    Doing well off medicine. Continue to monitor. Call with any concerns.         Musculoskeletal and Integument   Onychomycosis    Will treat with penlac. Call with any concerns. Continue to monitor.       Relevant Medications   ciclopirox (PENLAC) 8 % solution     Genitourinary   Kidney stone    Having some low back pain- will check labs and KUB to see if stones acting up again. Continue to monitor.       Relevant Orders   DG Abd 1 View   BPH (benign prostatic hyperplasia)    Doing well not on medicine. Rechecking labs today. Await results. Treat as needed.       Relevant Orders   PSA     Other   Alcohol use    Discussed cutting down. Information provided. Call with any concerns.       Other Visit Diagnoses     Routine general medical examination at a health care facility    -  Primary   Vaccines up to date. Screening labs checked today. Colonoscopy ordered today. Continue diet and exercise. Call with with any concerns. Continue to monitor.   Relevant Orders   Comprehensive metabolic panel   CBC with Differential/Platelet   Lipid Panel w/o Chol/HDL Ratio   PSA   TSH   Urinalysis, Routine w reflex microscopic   Microalbumin, Urine Waived   Screening for colon cancer       Referral to GI placed today.   Relevant Orders   Ambulatory referral to Gastroenterology   Acute bilateral low back pain without sciatica       Will start stretches and check for kidney stones. Call with any concerns.        LABORATORY TESTING:  Health maintenance labs ordered today as discussed above.   The natural history of prostate cancer and ongoing controversy regarding screening and potential treatment outcomes of prostate cancer has been discussed with the patient. The meaning of a false positive PSA and a false negative PSA has been discussed. He indicates understanding of the limitations of this screening test and wishes to proceed with screening PSA testing.   IMMUNIZATIONS:   - Tdap: Tetanus vaccination status reviewed: last tetanus booster within 10 years. - Influenza: Refused - Pneumovax: Up to date - Prevnar: Not applicable - COVID: Refused - HPV: Not applicable - Shingrix vaccine: Administered today  SCREENING: - Colonoscopy: Ordered today  Discussed with patient purpose of the colonoscopy is to detect colon cancer at curable precancerous or early stages   PATIENT COUNSELING:    Sexuality: Discussed sexually transmitted diseases, partner selection, use of condoms, avoidance of unintended pregnancy  and contraceptive alternatives.   Advised to avoid cigarette smoking.  I discussed with the patient that most people either abstain from alcohol or drink within safe limits (<=14/week and <=4 drinks/occasion for males, <=7/weeks and <= 3 drinks/occasion for females) and that the risk for alcohol disorders and other health  effects rises proportionally with the number of drinks per week and how often a drinker exceeds daily limits.  Discussed cessation/primary prevention of drug use and availability of treatment for abuse.   Diet: Encouraged to adjust caloric intake to maintain  or achieve ideal body weight, to reduce intake of dietary saturated fat and total fat, to limit sodium intake by avoiding high sodium foods and not adding table salt, and to maintain adequate dietary potassium and calcium preferably from fresh fruits, vegetables, and low-fat dairy products.    stressed the importance of  regular exercise  Injury prevention: Discussed safety belts, safety helmets, smoke detector, smoking near bedding or upholstery.   Dental health: Discussed importance of regular tooth brushing, flossing, and dental visits.   Follow up plan: NEXT PREVENTATIVE PHYSICAL DUE IN 1 YEAR. Return in about 1 year (around 05/15/2023) for physical.

## 2022-05-15 NOTE — Assessment & Plan Note (Signed)
Discussed cutting down. Information provided. Call with any concerns.

## 2022-05-16 LAB — COMPREHENSIVE METABOLIC PANEL
ALT: 20 IU/L (ref 0–44)
AST: 29 IU/L (ref 0–40)
Albumin/Globulin Ratio: 1.8 (ref 1.2–2.2)
Albumin: 4.6 g/dL (ref 3.8–4.9)
Alkaline Phosphatase: 89 IU/L (ref 44–121)
BUN/Creatinine Ratio: 12 (ref 9–20)
BUN: 11 mg/dL (ref 6–24)
Bilirubin Total: 0.5 mg/dL (ref 0.0–1.2)
CO2: 25 mmol/L (ref 20–29)
Calcium: 9.8 mg/dL (ref 8.7–10.2)
Chloride: 100 mmol/L (ref 96–106)
Creatinine, Ser: 0.92 mg/dL (ref 0.76–1.27)
Globulin, Total: 2.5 g/dL (ref 1.5–4.5)
Glucose: 89 mg/dL (ref 70–99)
Potassium: 4.5 mmol/L (ref 3.5–5.2)
Sodium: 139 mmol/L (ref 134–144)
Total Protein: 7.1 g/dL (ref 6.0–8.5)
eGFR: 97 mL/min/{1.73_m2} (ref >59)

## 2022-05-16 LAB — LIPID PANEL W/O CHOL/HDL RATIO
Cholesterol, Total: 211 mg/dL — ABNORMAL HIGH (ref 100–199)
HDL: 59 mg/dL (ref >39)
LDL Chol Calc (NIH): 141 mg/dL — ABNORMAL HIGH (ref 0–99)
Triglycerides: 63 mg/dL (ref 0–149)
VLDL Cholesterol Cal: 11 mg/dL (ref 5–40)

## 2022-05-16 LAB — CBC WITH DIFFERENTIAL/PLATELET
Basophils Absolute: 0 10*3/uL (ref 0.0–0.2)
Basos: 0 %
EOS (ABSOLUTE): 0.1 10*3/uL (ref 0.0–0.4)
Eos: 1 %
Hematocrit: 50.4 % (ref 37.5–51.0)
Hemoglobin: 16.9 g/dL (ref 13.0–17.7)
Immature Grans (Abs): 0 10*3/uL (ref 0.0–0.1)
Immature Granulocytes: 0 %
Lymphocytes Absolute: 1.7 10*3/uL (ref 0.7–3.1)
Lymphs: 25 %
MCH: 32.3 pg (ref 26.6–33.0)
MCHC: 33.5 g/dL (ref 31.5–35.7)
MCV: 96 fL (ref 79–97)
Monocytes Absolute: 0.5 10*3/uL (ref 0.1–0.9)
Monocytes: 8 %
Neutrophils Absolute: 4.5 10*3/uL (ref 1.4–7.0)
Neutrophils: 66 %
Platelets: 203 10*3/uL (ref 150–450)
RBC: 5.23 x10E6/uL (ref 4.14–5.80)
RDW: 12.8 % (ref 11.6–15.4)
WBC: 6.8 10*3/uL (ref 3.4–10.8)

## 2022-05-16 LAB — PSA: Prostate Specific Ag, Serum: 2.9 ng/mL (ref 0.0–4.0)

## 2022-05-16 LAB — TSH: TSH: 1.63 u[IU]/mL (ref 0.450–4.500)

## 2022-05-19 ENCOUNTER — Telehealth: Payer: Self-pay

## 2022-05-19 ENCOUNTER — Other Ambulatory Visit: Payer: Self-pay

## 2022-05-19 DIAGNOSIS — Z860100 Personal history of colon polyps, unspecified: Secondary | ICD-10-CM

## 2022-05-19 MED ORDER — SUTAB 1479-225-188 MG PO TABS: 12.0000 | ORAL_TABLET | Freq: Two times a day (BID) | ORAL | 0 refills | Status: AC

## 2022-05-19 NOTE — Telephone Encounter (Signed)
Gastroenterology Pre-Procedure Review  Request Date: 06/12/22 Requesting Physician: Dr. Allen Norris  Patient requested to have Colonoscopy in [place] since he lives in [place].  Discussed with both providers (Dr. Vicente Males and Dr. Allen Norris).  Both agreed that he could be scheduled with Dr. Allen Norris at [facility].  PATIENT REVIEW QUESTIONS: The patient responded to the following health history questions as indicated:    1. Are you having any GI issues? no 2. Do you have a personal history of Polyps? yes (last colonoscopy performed by Dr. Vicente Males 04/10/19 polyps were present) 3. Do you have a family history of Colon Cancer or Polyps? yes (mother colon polyps) 4. Diabetes Mellitus? no 5. Joint replacements in the past 12 months?no 6. Major health problems in the past 3 months?no 7. Any artificial heart valves, MVP, or defibrillator?no    MEDICATIONS & ALLERGIES:    Patient reports the following regarding taking any anticoagulation/antiplatelet therapy:   Plavix, Coumadin, Eliquis, Xarelto, Lovenox, Pradaxa, Brilinta, or Effient? no Aspirin? no  Patient confirms/reports the following medications:  Current Outpatient Medications  Medication Sig Dispense Refill   ciclopirox (PENLAC) 8 % solution Apply topically at bedtime. Apply over nail and surrounding skin. Apply daily over previous coat. After seven (7) days, may remove with alcohol and continue cycle. 6.6 mL 0   fluticasone (FLONASE) 50 MCG/ACT nasal spray Place 2 sprays into both nostrils daily.     No current facility-administered medications for this visit.    Patient confirms/reports the following allergies:  No Known Allergies  No orders of the defined types were placed in this encounter.   AUTHORIZATION INFORMATION Primary Insurance: 1D#: Group #:  Secondary Insurance: 1D#: Group #:  SCHEDULE INFORMATION: Date: 06/12/22 Time: Location: [place]

## 2022-06-04 ENCOUNTER — Encounter: Payer: Self-pay | Admitting: Gastroenterology

## 2022-06-05 NOTE — Anesthesia Preprocedure Evaluation (Addendum)
Anesthesia Evaluation  Patient identified by MRN, date of birth, ID band Patient awake    Reviewed: Allergy & Precautions, H&P , NPO status , Patient's Chart, lab work & pertinent test results  Airway Mallampati: II  TM Distance: >3 FB Neck ROM: Full    Dental no notable dental hx.    Pulmonary neg pulmonary ROS, COPD, Current Smoker   Pulmonary exam normal breath sounds clear to auscultation       Cardiovascular hypertension, Normal cardiovascular exam Rhythm:Regular Rate:Normal     Neuro/Psych negative neurological ROS  negative psych ROS   GI/Hepatic negative GI ROS, Neg liver ROS,,,  Endo/Other  negative endocrine ROS    Renal/GU Renal disease  negative genitourinary   Musculoskeletal negative musculoskeletal ROS (+)    Abdominal   Peds negative pediatric ROS (+)  Hematology negative hematology ROS (+)   Anesthesia Other Findings   Reproductive/Obstetrics negative OB ROS                             Anesthesia Physical Anesthesia Plan  ASA: 2  Anesthesia Plan: General   Post-op Pain Management:    Induction: Intravenous  PONV Risk Score and Plan:   Airway Management Planned: Natural Airway and Nasal Cannula  Additional Equipment:   Intra-op Plan:   Post-operative Plan:   Informed Consent: I have reviewed the patients History and Physical, chart, labs and discussed the procedure including the risks, benefits and alternatives for the proposed anesthesia with the patient or authorized representative who has indicated his/her understanding and acceptance.     Dental Advisory Given  Plan Discussed with: Anesthesiologist, CRNA and Surgeon  Anesthesia Plan Comments: (Patient consented for risks of anesthesia including but not limited to:  - adverse reactions to medications - risk of airway placement if required - damage to eyes, teeth, lips or other oral mucosa - nerve  damage due to positioning  - sore throat or hoarseness - Damage to heart, brain, nerves, lungs, other parts of body or loss of life  Patient voiced understanding.)       Anesthesia Quick Evaluation

## 2022-06-12 ENCOUNTER — Encounter
Admission: RE | Disposition: A | Discharge status: Home / Self Care | Payer: Self-pay | Source: Home / Self Care | Attending: Gastroenterology

## 2022-06-12 ENCOUNTER — Ambulatory Visit
Admission: RE | Admit: 2022-06-12 | Discharge: 2022-06-12 | Disposition: A | Discharge status: Home / Self Care | Payer: BC Managed Care – PPO | Attending: Gastroenterology | Admitting: Gastroenterology

## 2022-06-12 ENCOUNTER — Other Ambulatory Visit: Payer: Self-pay

## 2022-06-12 ENCOUNTER — Ambulatory Visit: Payer: BC Managed Care – PPO | Admitting: Anesthesiology

## 2022-06-12 ENCOUNTER — Encounter: Payer: Self-pay | Admitting: Gastroenterology

## 2022-06-12 DIAGNOSIS — D125 Benign neoplasm of sigmoid colon: Secondary | ICD-10-CM | POA: Insufficient documentation

## 2022-06-12 DIAGNOSIS — Z1211 Encounter for screening for malignant neoplasm of colon: Secondary | ICD-10-CM | POA: Diagnosis not present

## 2022-06-12 DIAGNOSIS — Z860100 Personal history of colon polyps, unspecified: Secondary | ICD-10-CM

## 2022-06-12 DIAGNOSIS — D128 Benign neoplasm of rectum: Secondary | ICD-10-CM

## 2022-06-12 HISTORY — PX: POLYPECTOMY: SHX5525

## 2022-06-12 HISTORY — PX: COLONOSCOPY WITH PROPOFOL: SHX5780

## 2022-06-12 SURGERY — COLONOSCOPY WITH PROPOFOL
Anesthesia: General | Site: Rectum

## 2022-06-12 MED ORDER — STERILE WATER FOR IRRIGATION IR SOLN
Status: DC | PRN
  Administered 2022-06-12: 240 mL

## 2022-06-12 MED ORDER — STERILE WATER FOR IRRIGATION IR SOLN
Status: DC | PRN
  Administered 2022-06-12: 150 mL

## 2022-06-12 MED ORDER — SODIUM CHLORIDE 0.9 % IV SOLN: INTRAVENOUS | Status: DC

## 2022-06-12 MED ORDER — PROPOFOL 10 MG/ML IV BOLUS
INTRAVENOUS | Status: DC | PRN
  Administered 2022-06-12: 40 mg via INTRAVENOUS
  Administered 2022-06-12: 30 mg via INTRAVENOUS
  Administered 2022-06-12: 40 mg via INTRAVENOUS
  Administered 2022-06-12: 80 mg via INTRAVENOUS
  Administered 2022-06-12: 30 mg via INTRAVENOUS
  Administered 2022-06-12: 60 mg via INTRAVENOUS
  Administered 2022-06-12: 30 mg via INTRAVENOUS

## 2022-06-12 MED ORDER — LACTATED RINGERS IV SOLN: INTRAVENOUS | Status: DC

## 2022-06-12 MED ORDER — LIDOCAINE HCL (CARDIAC) PF 100 MG/5ML IV SOSY
PREFILLED_SYRINGE | INTRAVENOUS | Status: DC | PRN
  Administered 2022-06-12: 50 mg via INTRAVENOUS

## 2022-06-12 SURGICAL SUPPLY — 21 items

## 2022-06-12 NOTE — Transfer of Care (Signed)
Immediate Anesthesia Transfer of Care Note  Patient: Nathaniel Howard  Procedure(s) Performed: COLONOSCOPY WITH PROPOFOL (Rectum) POLYPECTOMY (Rectum)  Patient Location: PACU  Anesthesia Type: General  Level of Consciousness: awake, alert  and patient cooperative  Airway and Oxygen Therapy: Patient Spontanous Breathing and Patient connected to supplemental oxygen  Post-op Assessment: Post-op Vital signs reviewed, Patient's Cardiovascular Status Stable, Respiratory Function Stable, Patent Airway and No signs of Nausea or vomiting  Post-op Vital Signs: Reviewed and stable  Complications: No notable events documented.

## 2022-06-12 NOTE — H&P (Signed)
Midge Minium, MD [facility] [address] [place], [place] [address] [phone] Fax : [phone]  Primary Care Physician:  Dorcas Carrow, DO Primary Gastroenterologist:  Dr. Servando Snare  Pre-Procedure History & Physical: HPI:  Nathaniel Howard is a 57 y.o. male is here for an colonoscopy.   Past Medical History:  Diagnosis Date   Kidney stone    Low back pain     Past Surgical History:  Procedure Laterality Date   COLONOSCOPY WITH PROPOFOL N/A 04/10/2019   Procedure: COLONOSCOPY WITH BIOPSY;  Surgeon: Wyline Mood, MD;  Location: [facility];  Service: Endoscopy;  Laterality: N/A;   EXTRACORPOREAL SHOCK WAVE LITHOTRIPSY Right 12/06/2014   Procedure: EXTRACORPOREAL SHOCK WAVE LITHOTRIPSY (ESWL);  Surgeon: Lorraine Lax, MD;  Location: [facility];  Service: Urology;  Laterality: Right;   LITHOTRIPSY Right    POLYPECTOMY N/A 04/10/2019   Procedure: POLYPECTOMY;  Surgeon: Wyline Mood, MD;  Location: [facility];  Service: Endoscopy;  Laterality: N/A;    Prior to Admission medications   Medication Sig Start Date End Date Taking? Authorizing Provider  ciclopirox (PENLAC) 8 % solution Apply topically at bedtime. Apply over nail and surrounding skin. Apply daily over previous coat. After seven (7) days, may remove with alcohol and continue cycle. 05/15/22  Yes Johnson, Megan P, DO  fluticasone (FLONASE) 50 MCG/ACT nasal spray Place 2 sprays into both nostrils daily.   Yes [provider]  Sodium Sulfate-Mag Sulfate-KCl (SUTAB) [id number] MG TABS Take 12 tablets by mouth 2 (two) times daily. 05/19/22  Yes Midge Minium, MD    Allergies as of 05/19/2022   (No Known Allergies)    Family History  Problem Relation Age of Onset   Nephrolithiasis Father    Cancer Father        Near Brain   Stroke Father    Factor V Leiden deficiency Mother    Hypertension Maternal Aunt    Diabetes Maternal Aunt    Heart attack Maternal Aunt    Hypertension Maternal Uncle     Diabetes Maternal Uncle    Heart disease Paternal Aunt    Hypertension Maternal Grandmother    Varicose Veins Maternal Grandmother    Stroke Maternal Grandmother    Cancer Paternal Grandmother        Liver   Nephrolithiasis Paternal Grandmother    Seizures Paternal Grandfather    Prostate cancer Neg Hx    Sickle cell trait Neg Hx    Kidney cancer Neg Hx     Social History   Socioeconomic History   Marital status: Married    Spouse name: Not on file   Number of children: Not on file   Years of education: Not on file   Highest education level: Not on file  Occupational History   Not on file  Tobacco Use   Smoking status: Every Day    Packs/day: .5    Types: Cigarettes   Smokeless tobacco: Former    Types: Associate Professor Use: Never used  Substance and Sexual Activity   Alcohol use: Yes    Alcohol/week: 18.0 standard drinks of alcohol    Types: 18 Cans of beer per week    Comment: daily   Drug use: Yes    Frequency: 8.0 times per week    Types: Marijuana    Comment: 1-2 times a day 3-4 days per week   Sexual activity: Yes    Partners: Male    Birth  control/protection: None  Other Topics Concern   Not on file  Social History Narrative   Not on file   Social Determinants of Health   Financial Resource Strain: Not on file  Food Insecurity: Not on file  Transportation Needs: Not on file  Physical Activity: Not on file  Stress: Not on file  Social Connections: Not on file  Intimate Partner Violence: Not on file    Review of Systems: See HPI, otherwise negative ROS  Physical Exam: Ht 5\' 11"  (1.803 m)   Wt 81.6 kg   BMI 25.10 kg/m  General:   Alert,  pleasant and cooperative in NAD Head:  Normocephalic and atraumatic. Neck:  Supple; no masses or thyromegaly. Lungs:  Clear throughout to auscultation.    Heart:  Regular rate and rhythm. Abdomen:  Soft, nontender and nondistended. Normal bowel sounds, without guarding, and without rebound.    Neurologic:  Alert and  oriented x4;  grossly normal neurologically.  Impression/Plan: Nathaniel Howard is here for an colonoscopy to be performed for a history of adenomatous polyps on 2021   Risks, benefits, limitations, and alternatives regarding  colonoscopy have been reviewed with the patient.  Questions have been answered.  All parties agreeable.   Midge Minium, MD  06/12/2022, 10:39 AM

## 2022-06-12 NOTE — Anesthesia Postprocedure Evaluation (Signed)
Anesthesia Post Note  Patient: Nathaniel Howard  Procedure(s) Performed: COLONOSCOPY WITH PROPOFOL (Rectum) POLYPECTOMY (Rectum)  Patient location during evaluation: PACU Anesthesia Type: General Level of consciousness: awake and alert Pain management: pain level controlled Vital Signs Assessment: post-procedure vital signs reviewed and stable Respiratory status: spontaneous breathing, nonlabored ventilation, respiratory function stable and patient connected to nasal cannula oxygen Cardiovascular status: blood pressure returned to baseline and stable Postop Assessment: no apparent nausea or vomiting Anesthetic complications: no   No notable events documented.   Last Vitals:  Vitals:   06/12/22 1109 06/12/22 1115  BP: 114/82 128/86  Pulse: 82 91  Resp: 14 17  Temp: 36.4 C 36.4 C  SpO2: 98% 100%    Last Pain:  Vitals:   06/12/22 1115  PainSc: 0-No pain                 Telisa Ohlsen C Dalana Pfahler

## 2022-06-12 NOTE — Op Note (Signed)
[facility] Patient Name: Nathaniel Howard Procedure Date: 06/12/2022 10:37 AM MRN: [id number] Account #: [id number] Date of Birth: 07-23-1965 Admit Type: Outpatient Age: 57 Room: [facility] OR ROOM 01 Gender: Male Note Status: Finalized Instrument Name: [id number] Procedure:             Colonoscopy Indications:           High risk colon cancer surveillance: Personal history                         of colonic polyps Providers:             Midge Minium MD, MD Referring MD:          Dorcas Carrow (Referring MD) Medicines:             Propofol per Anesthesia Complications:         No immediate complications. Procedure:             Pre-Anesthesia Assessment:                        - Prior to the procedure, a History and Physical was                         performed, and patient medications and allergies were                         reviewed. The patient's tolerance of previous                         anesthesia was also reviewed. The risks and benefits                         of the procedure and the sedation options and risks                         were discussed with the patient. All questions were                         answered, and informed consent was obtained. Prior                         Anticoagulants: The patient has taken no anticoagulant                         or antiplatelet agents. ASA Grade Assessment: II - A                         patient with mild systemic disease. After reviewing                         the risks and benefits, the patient was deemed in                         satisfactory condition to undergo the procedure.                        After obtaining informed consent, the colonoscope was  passed under direct vision. Throughout the procedure,                         the patient's blood pressure, pulse, and oxygen                         saturations were monitored continuously. The                          Colonoscope was introduced through the anus and                         advanced to the the cecum, identified by appendiceal                         orifice and ileocecal valve. The colonoscopy was                         performed without difficulty. The patient tolerated                         the procedure well. The quality of the bowel                         preparation was excellent. Findings:      The perianal and digital rectal examinations were normal.      A 3 to 5 mm polyp was found in the rectum. The polyp was sessile. The       polyp was removed with a cold snare. Resection and retrieval were       complete.      Three sessile polyps were found in the rectum. The polyps were 3 to 4 mm       in size. These polyps were removed with a cold biopsy forceps. Resection       and retrieval were complete.      A 4 mm polyp was found in the sigmoid colon. The polyp was sessile. The       polyp was removed with a cold biopsy forceps. Resection and retrieval       were complete. Impression:            - One 3 to 5 mm polyp in the rectum, removed with a                         cold snare. Resected and retrieved.                        - Three 3 to 4 mm polyps in the rectum, removed with a                         cold biopsy forceps. Resected and retrieved.                        - One 4 mm polyp in the sigmoid colon, removed with a                         cold biopsy forceps. Resected and retrieved. Recommendation:        - Discharge patient to  home.                        - Resume previous diet.                        - Continue present medications.                        - Await pathology results.                        - Repeat colonoscopy in 5 years for surveillance. Procedure Code(s):     --- Professional ---                        [phone], Colonoscopy, flexible; with removal of                         tumor(s), polyp(s), or other lesion(s) by snare                          technique                        [id number], 59, Colonoscopy, flexible; with biopsy, single                         or multiple Diagnosis Code(s):     --- Professional ---                        Z86.010, Personal history of colonic polyps                        D12.8, Benign neoplasm of rectum CPT copyright 2022 [employer]. All rights reserved. The codes documented in this report are preliminary and upon coder review may  be revised to meet current compliance requirements. Midge Minium MD, MD 06/12/2022 11:08:07 AM This report has been signed electronically. Number of Addenda: 0 Note Initiated On: 06/12/2022 10:37 AM Scope Withdrawal Time: 0 hours 11 minutes 24 seconds  Total Procedure Duration: 0 hours 15 minutes 11 seconds  Estimated Blood Loss:  Estimated blood loss: none.      [facility]

## 2022-06-15 ENCOUNTER — Encounter: Payer: Self-pay | Admitting: Gastroenterology

## 2022-06-15 LAB — SURGICAL PATHOLOGY

## 2022-08-28 ENCOUNTER — Ambulatory Visit: Payer: BC Managed Care – PPO

## 2023-04-22 IMAGING — CR DG FOOT COMPLETE 3+V*R*
4 series · 4 of 4 positions shown · non-contrast
Comparison: None.

CLINICAL DATA: Palpable abnormality at the first MTP joint

EXAM:
RIGHT FOOT COMPLETE - 3+ VIEW

[foot ap]
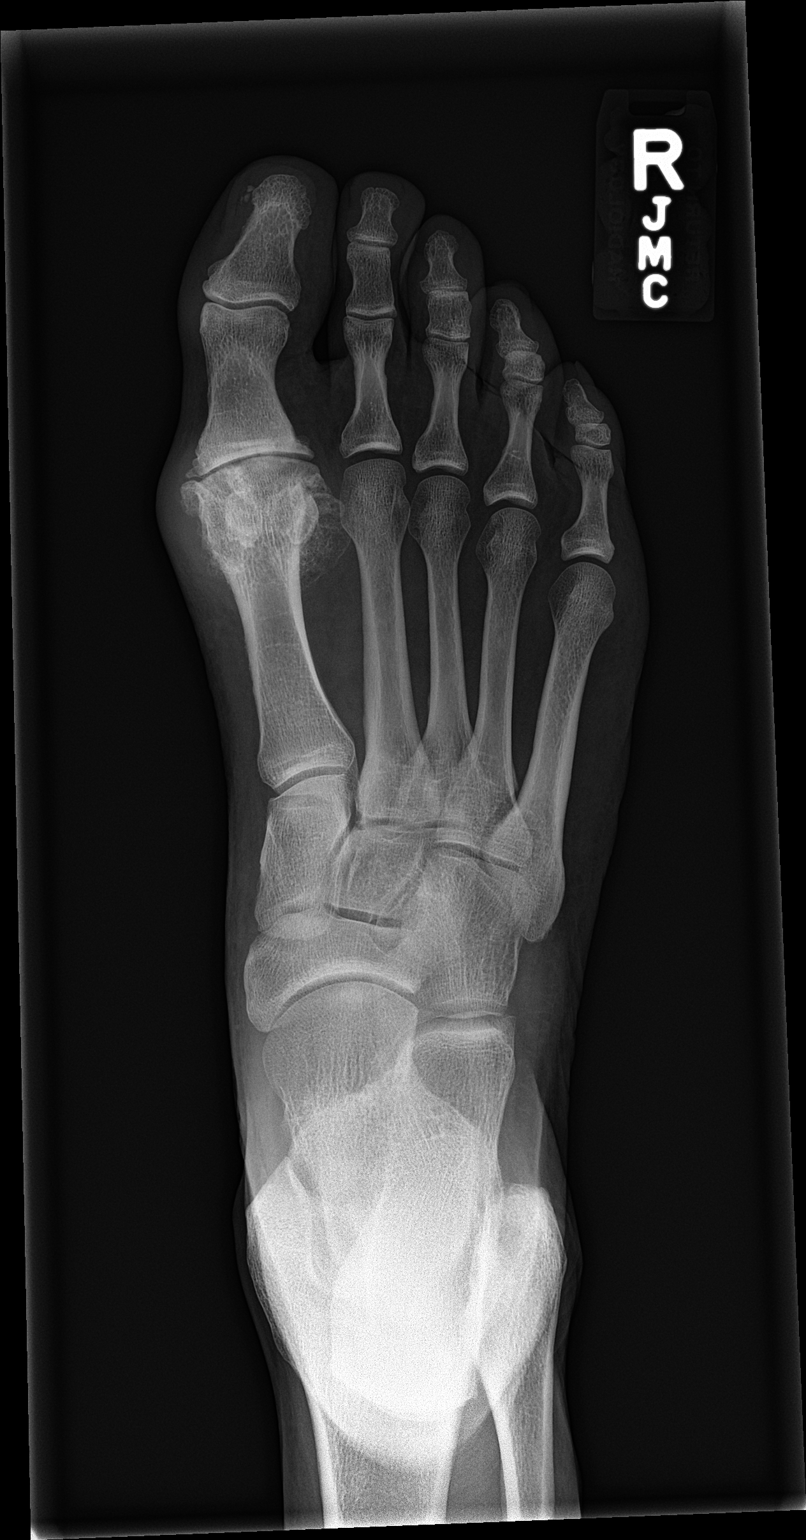

[foot obl (1 of 2)]
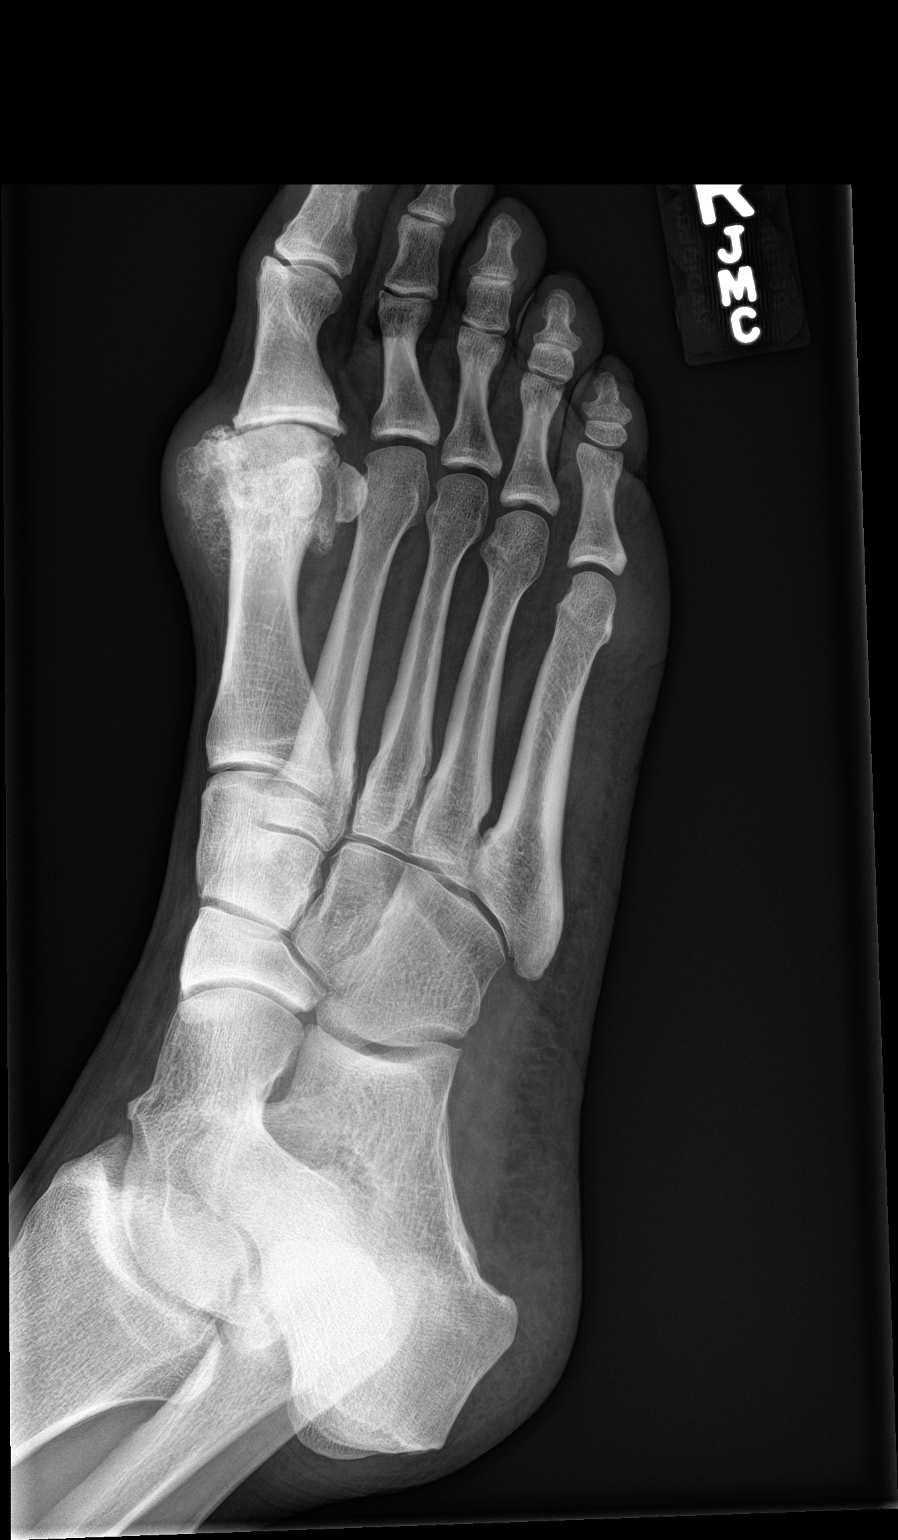

[foot lat]
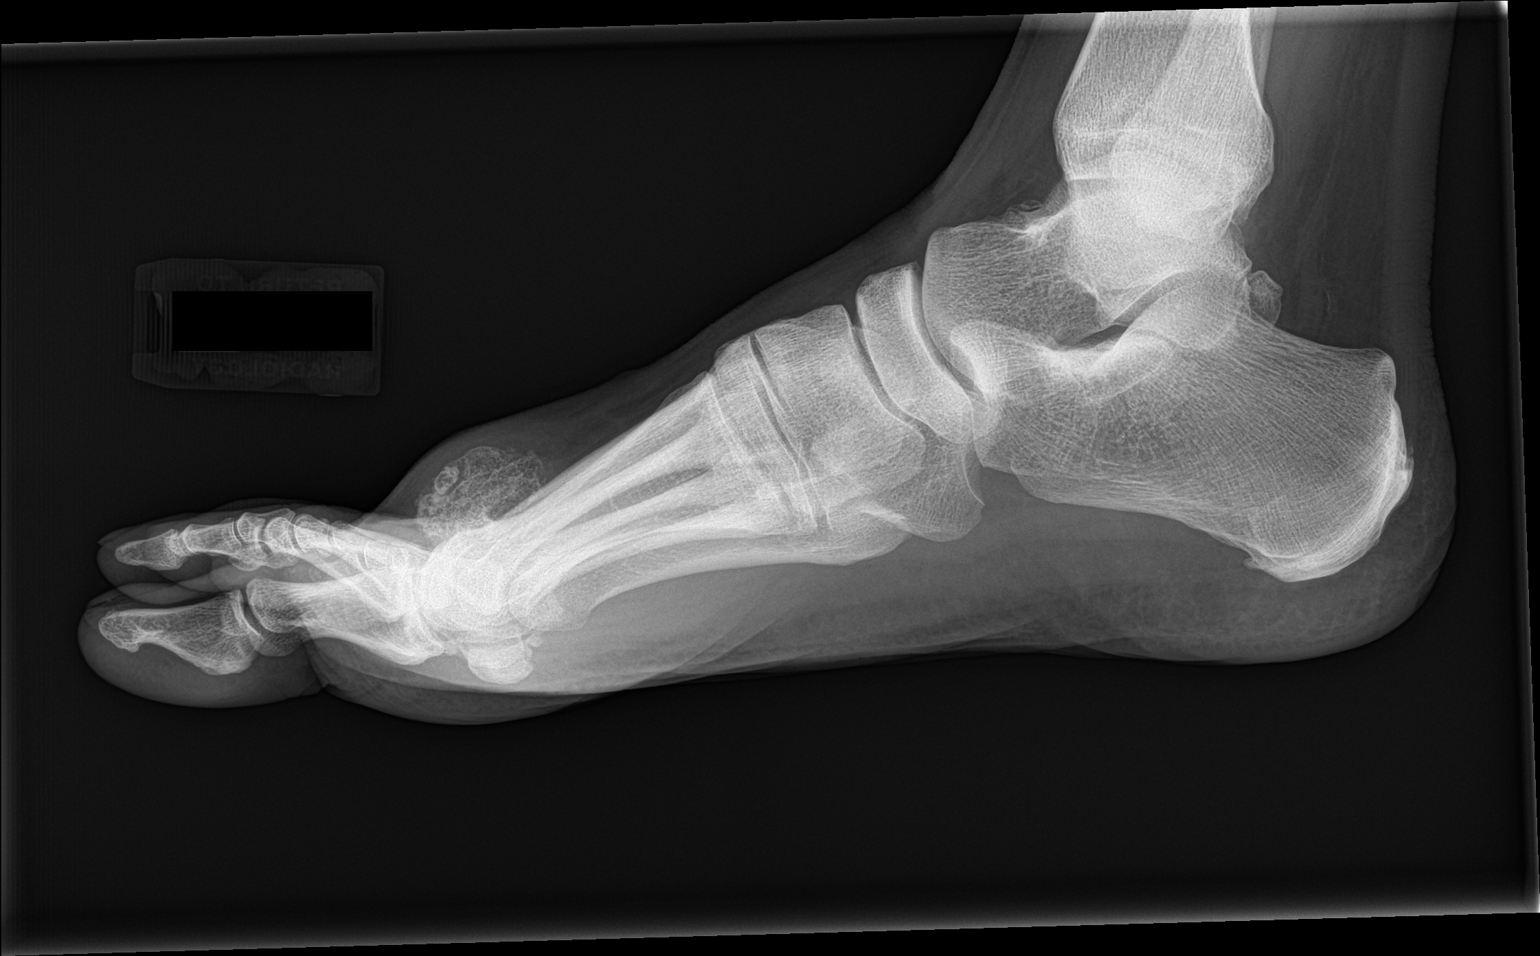

[foot obl (2 of 2)]
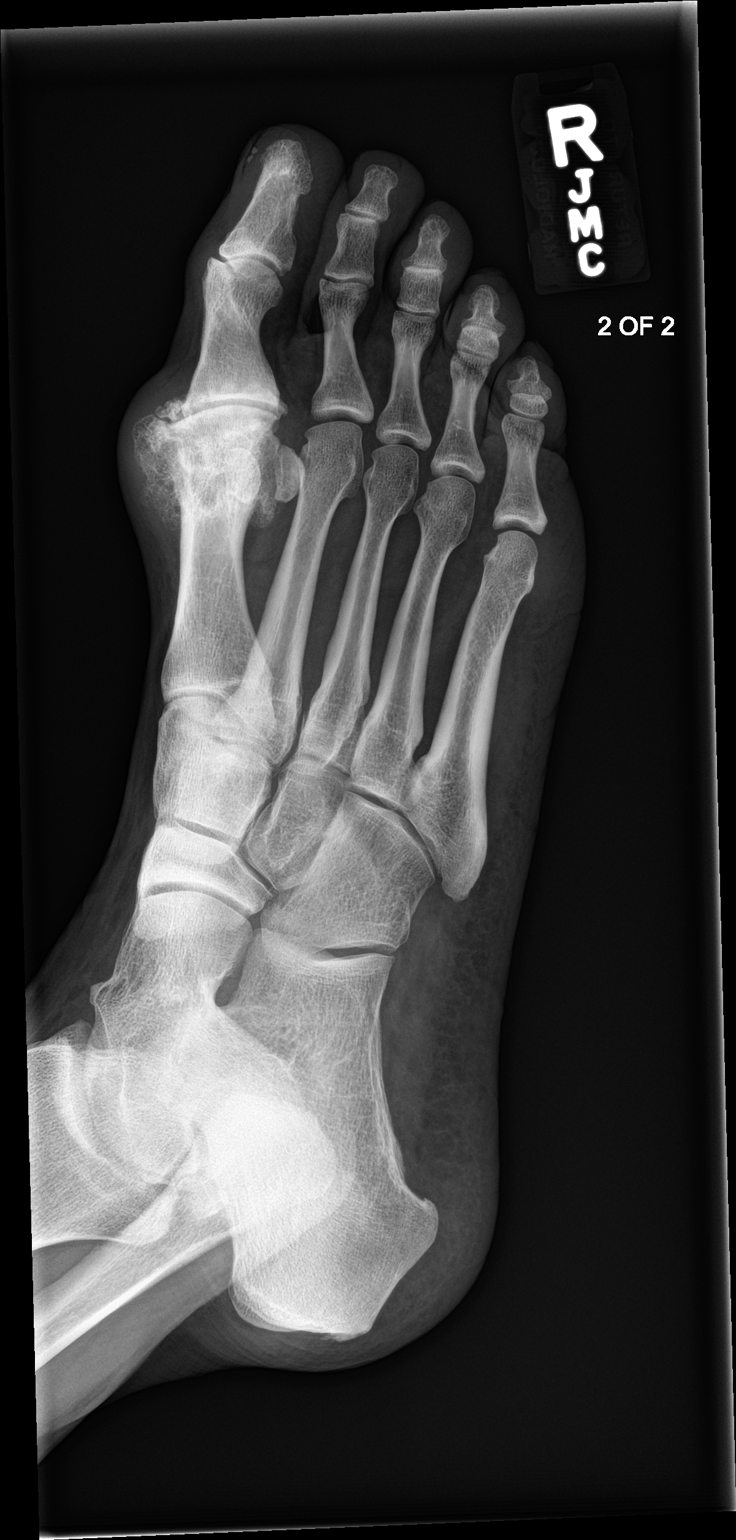

[4 of 4 positions shown; findings below may reference images not displayed]

FINDINGS: Degenerative changes are noted at the first MTP joint with mild
hallux valgus deformity. Exuberant calcification is noted over the
dorsum of the MTP joint. This may be related to underlying gouty
arthritis. Correlation with laboratory values is recommended. No
fracture or dislocation is seen.
IMPRESSION: Changes about the first MTP joint suspicious for gout. No other
focal abnormality is noted.

## 2023-12-10 DIAGNOSIS — H2513 Age-related nuclear cataract, bilateral: Secondary | ICD-10-CM | POA: Diagnosis not present
